# Patient Record
Sex: Female | Born: 1965 | Race: White | Hispanic: No | Smoking: Current every day smoker
Health system: Southern US, Community
[De-identification: ages and names within clinical notes are randomized; demographics above are authoritative.]

## PROBLEM LIST (undated history)

## (undated) DIAGNOSIS — F419 Anxiety disorder, unspecified: Secondary | ICD-10-CM

## (undated) DIAGNOSIS — M1990 Unspecified osteoarthritis, unspecified site: Secondary | ICD-10-CM

## (undated) DIAGNOSIS — I10 Essential (primary) hypertension: Secondary | ICD-10-CM

## (undated) HISTORY — DX: Anxiety disorder, unspecified: F41.9

## (undated) HISTORY — PX: OTHER SURGICAL HISTORY: SHX169

## (undated) HISTORY — DX: Unspecified osteoarthritis, unspecified site: M19.90

---

## 2003-03-28 ENCOUNTER — Other Ambulatory Visit: Admission: RE | Admit: 2003-03-28 | Discharge: 2003-03-28 | Payer: Self-pay | Admitting: Obstetrics and Gynecology

## 2003-11-22 ENCOUNTER — Ambulatory Visit (HOSPITAL_COMMUNITY): Admission: RE | Admit: 2003-11-22 | Discharge: 2003-11-22 | Payer: Self-pay | Admitting: Obstetrics & Gynecology

## 2006-05-17 ENCOUNTER — Ambulatory Visit: Payer: Self-pay | Admitting: Psychiatry

## 2006-05-17 ENCOUNTER — Emergency Department (HOSPITAL_COMMUNITY): Admission: EM | Admit: 2006-05-17 | Discharge: 2006-05-17 | Payer: Self-pay | Admitting: Emergency Medicine

## 2006-05-17 ENCOUNTER — Inpatient Hospital Stay (HOSPITAL_COMMUNITY): Admission: RE | Admit: 2006-05-17 | Discharge: 2006-05-20 | Payer: Self-pay | Admitting: Psychiatry

## 2010-04-23 ENCOUNTER — Emergency Department (HOSPITAL_COMMUNITY)
Admission: EM | Admit: 2010-04-23 | Discharge: 2010-04-23 | Disposition: A | Discharge status: Home / Self Care | Payer: Self-pay | Attending: Emergency Medicine | Admitting: Emergency Medicine

## 2010-04-23 DIAGNOSIS — M5430 Sciatica, unspecified side: Secondary | ICD-10-CM | POA: Insufficient documentation

## 2010-04-23 DIAGNOSIS — M549 Dorsalgia, unspecified: Secondary | ICD-10-CM | POA: Insufficient documentation

## 2011-03-04 ENCOUNTER — Emergency Department (HOSPITAL_COMMUNITY)
Admission: EM | Admit: 2011-03-04 | Discharge: 2011-03-04 | Disposition: A | Discharge status: Home / Self Care | Payer: Self-pay | Attending: Emergency Medicine | Admitting: Emergency Medicine

## 2011-03-04 ENCOUNTER — Encounter (HOSPITAL_COMMUNITY): Payer: Self-pay | Admitting: *Deleted

## 2011-03-04 DIAGNOSIS — M545 Low back pain, unspecified: Secondary | ICD-10-CM | POA: Insufficient documentation

## 2011-03-04 DIAGNOSIS — F17200 Nicotine dependence, unspecified, uncomplicated: Secondary | ICD-10-CM | POA: Insufficient documentation

## 2011-03-04 DIAGNOSIS — M549 Dorsalgia, unspecified: Secondary | ICD-10-CM

## 2011-03-04 DIAGNOSIS — I10 Essential (primary) hypertension: Secondary | ICD-10-CM | POA: Insufficient documentation

## 2011-03-04 DIAGNOSIS — Z79899 Other long term (current) drug therapy: Secondary | ICD-10-CM | POA: Insufficient documentation

## 2011-03-04 HISTORY — DX: Essential (primary) hypertension: I10

## 2011-03-04 MED ORDER — HYDROCODONE-ACETAMINOPHEN 5-325 MG PO TABS
2.0000 | ORAL_TABLET | Freq: Once | ORAL | Status: AC
  Administered 2011-03-04: 2 via ORAL
  Filled 2011-03-04: qty 2

## 2011-03-04 MED ORDER — DIAZEPAM 5 MG PO TABS
5.0000 mg | ORAL_TABLET | Freq: Once | ORAL | Status: AC
  Administered 2011-03-04: 5 mg via ORAL
  Filled 2011-03-04: qty 1

## 2011-03-04 MED ORDER — METHOCARBAMOL 500 MG PO TABS: ORAL_TABLET | ORAL | Status: DC

## 2011-03-04 MED ORDER — ONDANSETRON HCL 4 MG PO TABS
4.0000 mg | ORAL_TABLET | Freq: Once | ORAL | Status: AC
  Administered 2011-03-04: 4 mg via ORAL
  Filled 2011-03-04: qty 1

## 2011-03-04 MED ORDER — HYDROCODONE-ACETAMINOPHEN 5-500 MG PO TABS: 1.0000 | ORAL_TABLET | Freq: Four times a day (QID) | ORAL | Status: AC | PRN

## 2011-03-04 NOTE — ED Notes (Signed)
Pt states has had chronic back in past however a few days ago pt was assisting a resident with a bath when she felt strain on her back. Pt reports taking motrin without relief.

## 2011-03-04 NOTE — ED Provider Notes (Signed)
History     CSN: [id number]  Arrival date & time 03/04/11  1458   First MD Initiated Contact with Patient 03/04/11 1544      Chief Complaint  Patient presents with  . Back Pain    (Consider location/radiation/quality/duration/timing/severity/associated sxs/prior treatment) HPI Comments: The patient has history of lower back problems for the past 3 months she has been treating it with conservative management and most of the time the pain would go away. 4 days ago the patient states she bent over while at work and felt a strong pull in her back. Since that time she has had increasing pain of the lower back. This pain is aggravated by certain positions. She has not had a any loss of bowel or bladder function. There've been no falls.  The history is provided by the patient.    Past Medical History  Diagnosis Date  . Hypertension     Past Surgical History  Procedure Date  . Arm surgery     History reviewed. No pertinent family history.  History  Substance Use Topics  . Smoking status: Current Everyday Smoker  . Smokeless tobacco: Not on file  . Alcohol Use: Yes    OB History    Grav Para Term Preterm Abortions TAB SAB Ect Mult Living                  Review of Systems  Constitutional: Negative for activity change.       All ROS Neg except as noted in HPI  HENT: Negative for nosebleeds and neck pain.   Eyes: Negative for photophobia and discharge.  Respiratory: Negative for cough, shortness of breath and wheezing.   Cardiovascular: Negative for chest pain and palpitations.  Gastrointestinal: Negative for abdominal pain and blood in stool.  Genitourinary: Negative for dysuria, frequency and hematuria.  Musculoskeletal: Positive for back pain. Negative for arthralgias.  Skin: Negative.   Neurological: Negative for dizziness, seizures and speech difficulty.  Psychiatric/Behavioral: Negative for hallucinations and confusion.    Allergies  Review of patient's  allergies indicates no known allergies.  Home Medications   Current Outpatient Rx  Name Route Sig Dispense Refill  . GOODY HEADACHE PO Oral Take 1 packet by mouth once as needed. For pain    . IBUPROFEN 200 MG PO TABS Oral Take 800 mg by mouth as needed. For pain    . LISINOPRIL-HYDROCHLOROTHIAZIDE 20-12.5 MG PO TABS Oral Take 1 tablet by mouth daily.      BP 135/77  Pulse 88  Temp(Src) 98 F (36.7 C) (Oral)  Resp 20  Ht 5\' 2"  (1.575 m)  Wt 205 lb (92.987 kg)  BMI 37.49 kg/m2  SpO2 100%  LMP 02/11/2011  Physical Exam  Nursing note and vitals reviewed. Constitutional: She is oriented to person, place, and time. She appears well-developed and well-nourished.  Non-toxic appearance.  HENT:  Head: Normocephalic.  Right Ear: Tympanic membrane and external ear normal.  Left Ear: Tympanic membrane and external ear normal.  Eyes: EOM and lids are normal. Pupils are equal, round, and reactive to light.  Neck: Normal range of motion. Neck supple. Carotid bruit is not present.  Cardiovascular: Normal rate, regular rhythm, normal heart sounds, intact distal pulses and normal pulses.   Pulmonary/Chest: Breath sounds normal. No respiratory distress.  Abdominal: Soft. Bowel sounds are normal. There is no tenderness. There is no guarding.  Musculoskeletal: Normal range of motion.       Pain to palpation of the lumbar area.  Pain with attempted range of motion of the lumbar area.  Lymphadenopathy:       Head (right side): No submandibular adenopathy present.       Head (left side): No submandibular adenopathy present.    She has no cervical adenopathy.  Neurological: She is alert and oriented to person, place, and time. She has normal strength. No cranial nerve deficit or sensory deficit. She exhibits normal muscle tone. Coordination normal.       Gait within normal limits.  Skin: Skin is warm and dry.  Psychiatric: She has a normal mood and affect. Her speech is normal.    ED Course    Procedures (including critical care time) Pulse oximetry 100% on room air. Within normal limits by my interpretation. Labs Reviewed - No data to display No results found.   No diagnosis found.    MDM  I have reviewed nursing notes, vital signs, and all appropriate lab and imaging results for this patient.        Kathie Dike, [place] 03/04/11 [phone]

## 2011-03-04 NOTE — ED Notes (Signed)
Pt c/o lower back pain x 3 months. States that she bent over on Friday and it aggravated it. Pt states that the pain is worse.

## 2011-03-05 NOTE — ED Provider Notes (Signed)
Medical screening examination/treatment/procedure(s) were performed by non-physician practitioner and as supervising physician I was immediately available for consultation/collaboration.  Nicoletta Dress. Colon Branch, MD 03/05/11 1504

## 2015-03-06 ENCOUNTER — Telehealth: Payer: Self-pay | Admitting: Orthopaedic Surgery

## 2015-03-06 MED ORDER — HYDROCODONE-ACETAMINOPHEN 7.5-325 MG PO TABS: 1.0000 | ORAL_TABLET | ORAL | Status: DC | PRN

## 2015-03-06 NOTE — Telephone Encounter (Signed)
Rx printed

## 2015-03-06 NOTE — Telephone Encounter (Signed)
Have attempted to call patient several times to let her know her prescription is available for pick up but voicemail is full,  and I can not leave a message.

## 2015-04-04 ENCOUNTER — Telehealth: Payer: Self-pay | Admitting: Orthopaedic Surgery

## 2015-04-04 MED ORDER — HYDROCODONE-ACETAMINOPHEN 7.5-325 MG PO TABS: 1.0000 | ORAL_TABLET | ORAL | Status: DC | PRN

## 2015-04-04 NOTE — Telephone Encounter (Signed)
Rx printed

## 2015-04-04 NOTE — Telephone Encounter (Signed)
Patient called and requested a refill on Norco 7.5-325 mgs.  Qty 120.  This was last filled on 03-06-15. °

## 2015-05-01 ENCOUNTER — Ambulatory Visit (INDEPENDENT_AMBULATORY_CARE_PROVIDER_SITE_OTHER): Payer: Self-pay

## 2015-05-01 ENCOUNTER — Ambulatory Visit (INDEPENDENT_AMBULATORY_CARE_PROVIDER_SITE_OTHER): Payer: Self-pay | Admitting: Orthopaedic Surgery

## 2015-05-01 VITALS — BP 187/91 | HR 100 | Temp 98.8°F | Ht 62.0 in | Wt 198.6 lb

## 2015-05-01 DIAGNOSIS — M25562 Pain in left knee: Secondary | ICD-10-CM

## 2015-05-01 MED ORDER — HYDROCODONE-ACETAMINOPHEN 7.5-325 MG PO TABS: 1.0000 | ORAL_TABLET | ORAL | Status: DC | PRN

## 2015-05-01 NOTE — Progress Notes (Signed)
Patient Danielle [name], female DOB:07/10/1965, 50 y.o. VWU:[id number]  Chief Complaint  Patient presents with  . Follow-up    Bilateral knee pain left started hurting 10 days ago    HPI  Danielle [name] is a 50 y.o. female who has had pain in the right knee for some time.  The right knee has some swelling and popping.  She has no giving way, no trauma  About 10 days ago the left knee, which had been her "good" knee, started having pain, swelling.  She has no instability but felt once the knee might buckle but it did not.  She has no redness.  She has tried ice and heat with no help.  The swelling has gotten worse.  It hurts most of the time now.  Her medicine has not helped.  HPI  Body mass index is 36.32 kg/(m^2).   Review of Systems  HENT: Negative for congestion.   Respiratory: Negative for cough and shortness of breath.   Cardiovascular: Negative for chest pain and leg swelling.  Endocrine: Positive for cold intolerance.  Musculoskeletal: Positive for myalgias, joint swelling, arthralgias and gait problem.  Allergic/Immunologic: Negative for environmental allergies.    Past Medical History  Diagnosis Date  . Hypertension     Past Surgical History  Procedure Laterality Date  . Arm surgery      No family history on file.  Social History Social History  Substance Use Topics  . Smoking status: Current Every Day Smoker  . Smokeless tobacco: Not on file  . Alcohol Use: Yes    No Known Allergies  Current Outpatient Prescriptions  Medication Sig Dispense Refill  . HYDROcodone-acetaminophen (NORCO) 7.5-325 MG tablet Take 1 tablet by mouth every 4 (four) hours as needed for moderate pain (Must last 30 days.  Do not drive or operate machinery while taking this medicine.). 120 tablet 0  . ibuprofen (ADVIL,MOTRIN) 200 MG tablet Take 800 mg by mouth as needed. For pain    . Aspirin-Acetaminophen-Caffeine (GOODY HEADACHE PO) Take 1 packet by mouth once as needed. Reported on  05/01/2015    . lisinopril-hydrochlorothiazide (PRINZIDE,ZESTORETIC) 20-12.5 MG per tablet Take 1 tablet by mouth daily. Reported on 05/01/2015    . methocarbamol (ROBAXIN) 500 MG tablet 2 po tid for spasm (Patient not taking: Reported on 05/01/2015) 30 tablet 0   No current facility-administered medications for this visit.     Physical Exam  Blood pressure 187/91, pulse 100, temperature 98.8 F (37.1 C), height  (1.575 m), weight 198 lb 9.6 oz (90.084 kg).  Constitutional: overall normal hygiene, normal nutrition, well developed, normal grooming, normal body habitus. Assistive device:none  Musculoskeletal: gait and station Limp left, muscle tone and strength are normal, no tremors or atrophy is present.  .  Neurological: coordination overall normal.  Deep tendon reflex/nerve stretch intact.  Sensation normal.  Cranial nerves II-XII intact.   Skin:   normal overall no scars, lesions, ulcers or rashes. No psoriasis.  Psychiatric: Alert and oriented x 3.  Recent memory intact, remote memory unclear.  Normal mood and affect. Well groomed.  Good eye contact.  Cardiovascular: overall no swelling, no varicosities, no edema bilaterally, normal temperatures of the legs and arms, no clubbing, cyanosis and good capillary refill.  Lymphatic: palpation is normal.  The left lower extremity is examined:  Inspection:  Thigh:  Non-tender and no defects  Knee has swelling 2+ effusion.  Joint tenderness is present                        Patient is tender over the medial joint line  Lower Leg:  Has normal appearance and no tenderness or defects  Ankle:  Non-tender and no defects  Foot:  Non-tender and no defects Range of Motion:  Knee:  Range of motion is: 0-105                        Crepitus is  present  Ankle:  Range of motion is normal. Strength and Tone:  The left lower extremity has normal strength and tone. Stability:  Knee:  The knee is stable.  Ankle:  The  ankle is stable.  The right lower extremity is examined:  Inspection:  Thigh:  Non-tender and no defects  Knee does not have swelling 0 effusion.                        Joint tenderness is present                        Patient is tender over the medial joint line  Lower Leg:  Has normal appearance and no tenderness or defects  Ankle:  Non-tender and no defects  Foot:  Non-tender and no defects Range of Motion:  Knee:  Range of motion is: 0-110                        Crepitus is  present  Ankle:  Range of motion is normal. Strength and Tone:  The right lower extremity has normal strength and tone. Stability:  Knee:  The knee is stable.  Ankle:  The ankle is stable.  She has hypertension and it is well controlled. She has no edema.  She watches her salt intake.  She smokes.  I have talked to her about cutting back or stopping.  The patient has been educated about the nature of the problem(s) and counseled on treatment options.  The patient appeared to understand what I have discussed and is in agreement with it.  Encounter Diagnosis  Name Primary?  . Left knee pain Yes   PROCEDURE NOTE:  The patient request injection, verbal consent was obtained.  The left knee was prepped appropriately after time out was performed.   Sterile technique was observed and anesthesia was provided by ethyl chloride and a 20-gauge needle was used to inject the knee area.  A 16-gauge needle was then used to aspirate the knee.  Color of fluid aspirated was clear light amber  Total cc's aspirated was 30.    Injection of 1 cc of Depo-Medrol 40 mg with several cc's of plain xylocaine was then performed.  A band aid dressing was applied.  The patient was advised to apply ice later today and tomorrow to the injection sight as needed. PLAN Call if any problems.  Precautions discussed.  Continue current medications.   Return to clinic 6 weeks

## 2015-06-04 ENCOUNTER — Telehealth: Payer: Self-pay | Admitting: Orthopaedic Surgery

## 2015-06-04 MED ORDER — HYDROCODONE-ACETAMINOPHEN 7.5-325 MG PO TABS: 1.0000 | ORAL_TABLET | ORAL | Status: DC | PRN

## 2015-06-04 NOTE — Telephone Encounter (Signed)
Hydrocodone-Acetaminophen 7.5/325mg Qty 120 Tablets °

## 2015-06-04 NOTE — Telephone Encounter (Signed)
Rx Done . 

## 2015-06-11 ENCOUNTER — Encounter: Payer: Self-pay | Admitting: Orthopaedic Surgery

## 2015-06-11 ENCOUNTER — Ambulatory Visit (INDEPENDENT_AMBULATORY_CARE_PROVIDER_SITE_OTHER): Payer: Self-pay | Admitting: Orthopaedic Surgery

## 2015-06-11 VITALS — BP 197/96 | HR 96 | Temp 98.1°F | Ht 62.0 in | Wt 198.0 lb

## 2015-06-11 DIAGNOSIS — M25562 Pain in left knee: Secondary | ICD-10-CM

## 2015-06-11 NOTE — Progress Notes (Signed)
CC:  I have pain of my left knee. I would like an injection.  The patient has chronic pain of the left knee.  There is no recent trauma.  There is no redness.  Injections in the past have helped.  The knee has no redness, has an effusion and crepitus present.  ROM of the knee is 0-105.  Impression:  Chronic knee pain left.  Return: six weeks  PROCEDURE NOTE:  The patient requests injections of the left knee , verbal consent was obtained.  The left knee was prepped appropriately after time out was performed.   Sterile technique was observed and injection of 1 cc of Depo-Medrol 40 mg with several cc's of plain xylocaine. Anesthesia was provided by ethyl chloride and a 20-gauge needle was used to inject the knee area. The injection was tolerated well.  A band aid dressing was applied.  The patient was advised to apply ice later today and tomorrow to the injection sight as needed. 

## 2015-06-12 ENCOUNTER — Ambulatory Visit: Payer: Self-pay | Admitting: Orthopaedic Surgery

## 2015-07-03 ENCOUNTER — Telehealth: Payer: Self-pay | Admitting: Orthopaedic Surgery

## 2015-07-03 MED ORDER — HYDROCODONE-ACETAMINOPHEN 7.5-325 MG PO TABS: 1.0000 | ORAL_TABLET | ORAL | Status: DC | PRN

## 2015-07-03 NOTE — Telephone Encounter (Signed)
Rx done. 

## 2015-07-03 NOTE — Telephone Encounter (Signed)
Patient called and requested a refill on Norco  7.5-325 mgs. Qty 120   Sig: Take 1 tablet by mouth every 4 (four) hours as needed for moderate pain (Must last 30 days.  Do not drive or operate machinery while taking this medicine °

## 2015-07-23 ENCOUNTER — Ambulatory Visit: Payer: Self-pay | Admitting: Orthopaedic Surgery

## 2015-08-01 ENCOUNTER — Ambulatory Visit (INDEPENDENT_AMBULATORY_CARE_PROVIDER_SITE_OTHER): Payer: Self-pay | Admitting: Orthopaedic Surgery

## 2015-08-01 ENCOUNTER — Encounter: Payer: Self-pay | Admitting: Orthopaedic Surgery

## 2015-08-01 VITALS — BP 186/93 | HR 104 | Temp 98.6°F | Ht 62.0 in | Wt 207.0 lb

## 2015-08-01 DIAGNOSIS — M25562 Pain in left knee: Secondary | ICD-10-CM

## 2015-08-01 MED ORDER — HYDROCODONE-ACETAMINOPHEN 7.5-325 MG PO TABS: 1.0000 | ORAL_TABLET | ORAL | Status: DC | PRN

## 2015-08-01 NOTE — Progress Notes (Signed)
CC:  I have pain of my right knee. I would like an injection.  The patient has chronic pain of the right knee.  There is no recent trauma.  There is no redness.  Injections in the past have helped.  The knee has no redness, has an effusion and crepitus present.  ROM of the knee is 0-110.  Impression:  Chronic knee pain right.  Return: 2 months   PROCEDURE NOTE:  The patient requests injections of the right knee , verbal consent was obtained.  The right knee was prepped appropriately after time out was performed.   Sterile technique was observed and injection of 1 cc of Depo-Medrol 40 mg with several cc's of plain xylocaine. Anesthesia was provided by ethyl chloride and a 20-gauge needle was used to inject the knee area. The injection was tolerated well.  A band aid dressing was applied.  The patient was advised to apply ice later today and tomorrow to the injection sight as needed.  Electronically Signed Darreld McleanWayne Shevaun Lovan, MD 7/6/20173:06 PM

## 2015-08-07 ENCOUNTER — Emergency Department (HOSPITAL_COMMUNITY): Payer: Self-pay

## 2015-08-07 ENCOUNTER — Encounter (HOSPITAL_COMMUNITY): Payer: Self-pay | Admitting: Emergency Medicine

## 2015-08-07 ENCOUNTER — Inpatient Hospital Stay (HOSPITAL_COMMUNITY)
Admission: EM | Admit: 2015-08-07 | Discharge: 2015-08-09 | DRG: 193 | Disposition: A | Discharge status: Home / Self Care | Payer: Self-pay | Attending: Internal Medicine | Admitting: Internal Medicine

## 2015-08-07 DIAGNOSIS — R6510 Systemic inflammatory response syndrome (SIRS) of non-infectious origin without acute organ dysfunction: Secondary | ICD-10-CM

## 2015-08-07 DIAGNOSIS — E876 Hypokalemia: Secondary | ICD-10-CM | POA: Diagnosis present

## 2015-08-07 DIAGNOSIS — I10 Essential (primary) hypertension: Secondary | ICD-10-CM | POA: Diagnosis present

## 2015-08-07 DIAGNOSIS — J189 Pneumonia, unspecified organism: Principal | ICD-10-CM | POA: Diagnosis present

## 2015-08-07 DIAGNOSIS — J9601 Acute respiratory failure with hypoxia: Secondary | ICD-10-CM | POA: Diagnosis present

## 2015-08-07 DIAGNOSIS — Z8249 Family history of ischemic heart disease and other diseases of the circulatory system: Secondary | ICD-10-CM

## 2015-08-07 DIAGNOSIS — F17210 Nicotine dependence, cigarettes, uncomplicated: Secondary | ICD-10-CM | POA: Diagnosis present

## 2015-08-07 DIAGNOSIS — D72829 Elevated white blood cell count, unspecified: Secondary | ICD-10-CM

## 2015-08-07 LAB — CBC WITH DIFFERENTIAL/PLATELET
BASOS PCT: 0 %
Basophils Absolute: 0.1 10*3/uL (ref 0.0–0.1)
EOS PCT: 1 %
Eosinophils Absolute: 0.1 10*3/uL (ref 0.0–0.7)
HEMATOCRIT: 33.9 % — AB (ref 36.0–46.0)
Hemoglobin: 11.4 g/dL — ABNORMAL LOW (ref 12.0–15.0)
LYMPHS PCT: 5 %
Lymphs Abs: 0.9 10*3/uL (ref 0.7–4.0)
MCH: 30.4 pg (ref 26.0–34.0)
MCHC: 33.6 g/dL (ref 30.0–36.0)
MCV: 90.4 fL (ref 78.0–100.0)
MONO ABS: 0.4 10*3/uL (ref 0.1–1.0)
MONOS PCT: 3 %
NEUTROS ABS: 15.5 10*3/uL — AB (ref 1.7–7.7)
Neutrophils Relative %: 91 %
PLATELETS: 372 10*3/uL (ref 150–400)
RBC: 3.75 MIL/uL — ABNORMAL LOW (ref 3.87–5.11)
RDW: 12.8 % (ref 11.5–15.5)
WBC: 17 10*3/uL — ABNORMAL HIGH (ref 4.0–10.5)

## 2015-08-07 LAB — I-STAT TROPONIN, ED: Troponin i, poc: 0 ng/mL (ref 0.00–0.08)

## 2015-08-07 LAB — BASIC METABOLIC PANEL
Anion gap: 10 (ref 5–15)
BUN: 8 mg/dL (ref 6–20)
CALCIUM: 8.4 mg/dL — AB (ref 8.9–10.3)
CO2: 26 mmol/L (ref 22–32)
CREATININE: 0.74 mg/dL (ref 0.44–1.00)
Chloride: 101 mmol/L (ref 101–111)
GFR calc Af Amer: >60 mL/min (ref >60)
GFR calc non Af Amer: >60 mL/min (ref >60)
GLUCOSE: 156 mg/dL — AB (ref 65–99)
Potassium: 2.9 mmol/L — ABNORMAL LOW (ref 3.5–5.1)
Sodium: 137 mmol/L (ref 135–145)

## 2015-08-07 LAB — LACTIC ACID, PLASMA: Lactic Acid, Venous: 1 mmol/L (ref 0.5–1.9)

## 2015-08-07 LAB — MAGNESIUM: MAGNESIUM: 2.1 mg/dL (ref 1.7–2.4)

## 2015-08-07 LAB — STREP PNEUMONIAE URINARY ANTIGEN: Strep Pneumo Urinary Antigen: NEGATIVE

## 2015-08-07 LAB — BRAIN NATRIURETIC PEPTIDE: B Natriuretic Peptide: 99 pg/mL (ref 0.0–100.0)

## 2015-08-07 MED ORDER — HYDROCODONE-ACETAMINOPHEN 7.5-325 MG PO TABS
1.0000 | ORAL_TABLET | ORAL | Status: DC | PRN
  Administered 2015-08-09: 1 via ORAL
  Filled 2015-08-07: qty 1

## 2015-08-07 MED ORDER — ONDANSETRON HCL 4 MG/2ML IJ SOLN: 4.0000 mg | Freq: Three times a day (TID) | INTRAMUSCULAR | Status: AC | PRN

## 2015-08-07 MED ORDER — ONDANSETRON HCL 4 MG/2ML IJ SOLN: 4.0000 mg | Freq: Four times a day (QID) | INTRAMUSCULAR | Status: DC | PRN

## 2015-08-07 MED ORDER — ONDANSETRON HCL 4 MG PO TABS: 4.0000 mg | ORAL_TABLET | Freq: Four times a day (QID) | ORAL | Status: DC | PRN

## 2015-08-07 MED ORDER — METHYLPREDNISOLONE SODIUM SUCC 125 MG IJ SOLR
125.0000 mg | Freq: Once | INTRAMUSCULAR | Status: AC
  Administered 2015-08-07: 125 mg via INTRAVENOUS
  Filled 2015-08-07: qty 2

## 2015-08-07 MED ORDER — AZITHROMYCIN 250 MG PO TABS
500.0000 mg | ORAL_TABLET | Freq: Once | ORAL | Status: AC
  Administered 2015-08-07: 500 mg via ORAL
  Filled 2015-08-07: qty 2

## 2015-08-07 MED ORDER — IPRATROPIUM BROMIDE 0.02 % IN SOLN
0.5000 mg | Freq: Once | RESPIRATORY_TRACT | Status: AC
  Administered 2015-08-07: 0.5 mg via RESPIRATORY_TRACT
  Filled 2015-08-07: qty 2.5

## 2015-08-07 MED ORDER — ACETAMINOPHEN 650 MG RE SUPP: 650.0000 mg | Freq: Four times a day (QID) | RECTAL | Status: DC | PRN

## 2015-08-07 MED ORDER — ACETAMINOPHEN 325 MG PO TABS: 650.0000 mg | ORAL_TABLET | Freq: Four times a day (QID) | ORAL | Status: DC | PRN

## 2015-08-07 MED ORDER — LISINOPRIL 10 MG PO TABS
20.0000 mg | ORAL_TABLET | Freq: Every day | ORAL | Status: DC
  Administered 2015-08-07 – 2015-08-09 (×3): 20 mg via ORAL
  Filled 2015-08-07 (×3): qty 2

## 2015-08-07 MED ORDER — AZITHROMYCIN 250 MG PO TABS
500.0000 mg | ORAL_TABLET | ORAL | Status: DC
  Administered 2015-08-08: 500 mg via ORAL
  Filled 2015-08-07: qty 2

## 2015-08-07 MED ORDER — SODIUM CHLORIDE 0.9 % IV SOLN
INTRAVENOUS | Status: DC
  Administered 2015-08-07 – 2015-08-09 (×3): via INTRAVENOUS

## 2015-08-07 MED ORDER — ALBUTEROL SULFATE (2.5 MG/3ML) 0.083% IN NEBU
5.0000 mg | INHALATION_SOLUTION | Freq: Once | RESPIRATORY_TRACT | Status: AC
Start: 2015-08-07 — End: 2015-08-07
  Administered 2015-08-07: 5 mg via RESPIRATORY_TRACT
  Filled 2015-08-07: qty 6

## 2015-08-07 MED ORDER — BENZONATATE 100 MG PO CAPS
100.0000 mg | ORAL_CAPSULE | Freq: Two times a day (BID) | ORAL | Status: DC | PRN
  Administered 2015-08-08 – 2015-08-09 (×3): 100 mg via ORAL
  Filled 2015-08-07 (×3): qty 1

## 2015-08-07 MED ORDER — POTASSIUM CHLORIDE CRYS ER 20 MEQ PO TBCR
40.0000 meq | EXTENDED_RELEASE_TABLET | ORAL | Status: AC
  Administered 2015-08-07 – 2015-08-08 (×4): 40 meq via ORAL
  Filled 2015-08-07 (×4): qty 2

## 2015-08-07 MED ORDER — SENNOSIDES-DOCUSATE SODIUM 8.6-50 MG PO TABS: 1.0000 | ORAL_TABLET | Freq: Every evening | ORAL | Status: DC | PRN

## 2015-08-07 MED ORDER — CEFTRIAXONE SODIUM 1 G IJ SOLR
1.0000 g | Freq: Once | INTRAMUSCULAR | Status: AC
  Administered 2015-08-07: 1 g via INTRAVENOUS
  Filled 2015-08-07: qty 10

## 2015-08-07 MED ORDER — NICOTINE 21 MG/24HR TD PT24
21.0000 mg | MEDICATED_PATCH | Freq: Every day | TRANSDERMAL | Status: DC
  Administered 2015-08-08 – 2015-08-09 (×2): 21 mg via TRANSDERMAL
  Filled 2015-08-07 (×3): qty 1

## 2015-08-07 MED ORDER — ENOXAPARIN SODIUM 40 MG/0.4ML ~~LOC~~ SOLN
40.0000 mg | SUBCUTANEOUS | Status: DC
  Administered 2015-08-07 – 2015-08-08 (×2): 40 mg via SUBCUTANEOUS
  Filled 2015-08-07 (×2): qty 0.4

## 2015-08-07 MED ORDER — DEXTROSE 5 % IV SOLN
1.0000 g | INTRAVENOUS | Status: DC
  Administered 2015-08-08: 1 g via INTRAVENOUS
  Filled 2015-08-07 (×5): qty 10

## 2015-08-07 NOTE — ED Notes (Signed)
After walking from triage, pt was 68% on RA. Pt has some shortness of breath. Pt is alert and oriented. Pt was placed on 2L with increase to 91%.

## 2015-08-07 NOTE — H&P (Signed)
History and Physical    CORSICA FRANSON ZOX:[id number] DOB: [birth year] DOA: 08/07/2015  Referring MD/NP/PA: Nelva Nay, EDP PCP: Remus Loffler, PA  Patient coming from: Home  Chief Complaint: Shortness of breath, cough  HPI: Danielle [name] is a 50 y.o. female with history of hypertension presents to the hospital today with a one-week history of shortness of breath and cough. She states that she started with a sinus infection about a week ago, did not seek medical attention. Over the past 3 day shortness of breath has progressed, she also has a cough productive of green sputum. Denies any fevers or chills. She checked her oxygen level at home and was found to be 80%, she came to the hospital for evaluation and management. In the ED she was found to have an oxygen level of low 90s on room air, a potassium level of 2.9, WBCs count of 17, a chest x-ray showed diffuse bilateral interstitial airspace opacities question diffuse infection/pneumonia versus edema. Admission has been requested for further evaluation and management.  Past Medical/Surgical History: Past Medical History  Diagnosis Date  . Hypertension     Past Surgical History  Procedure Laterality Date  . Arm surgery      Social History:  reports that she has been smoking Cigarettes.  She has been smoking about 1.00 pack per day. She does not have any smokeless tobacco history on file. She reports that she drinks alcohol. She reports that she does not use illicit drugs.  Allergies: Allergies  Allergen Reactions  . Penicillins Rash    Has patient had a PCN reaction causing immediate rash, facial/tongue/throat swelling, SOB or lightheadedness with hypotension: Yesyes Has patient had a PCN reaction causing severe rash involving mucus membranes or skin necrosis: No Has patient had a PCN reaction that required hospitalization No Has patient had a PCN reaction occurring within the last 10 years: No If all of the above answers are  "NO", then may proceed with Cephalosporin use.     Family History:  Significant for hypertension in mother  Prior to Admission medications   Medication Sig Start Date End Date Taking? Authorizing Provider  HYDROcodone-acetaminophen (NORCO) 7.5-325 MG tablet Take 1 tablet by mouth every 4 (four) hours as needed for moderate pain (Must last 30 days.  Do not drive or operate machinery while taking this medicine.). 08/01/15  Yes Darreld Mclean, MD  Aspirin-Acetaminophen-Caffeine (GOODY HEADACHE PO) Take 1 packet by mouth once as needed. Reported on 05/01/2015    Historical Provider, MD  ibuprofen (ADVIL,MOTRIN) 200 MG tablet Take 800 mg by mouth every 8 (eight) hours as needed for mild pain. For pain    Historical Provider, MD  lisinopril-hydrochlorothiazide (PRINZIDE,ZESTORETIC) 20-12.5 MG per tablet Take 1 tablet by mouth daily. Reported on 08/01/2015    Historical Provider, MD  methocarbamol (ROBAXIN) 500 MG tablet 2 po tid for spasm Patient not taking: Reported on 08/07/2015 03/04/11   Ivery Quale, PA-C    Review of Systems: Constitutional: Denies fever, chills, diaphoresis, appetite change and fatigue.  HEENT: Denies photophobia, eye pain, redness, hearing loss, ear pain, congestion, sore throat, rhinorrhea, sneezing, mouth sores, trouble swallowing, neck pain, neck stiffness and tinnitus.   Respiratory: Positive for SOB, DOE, cough, chest tightness,  and wheezing.   Cardiovascular: Denies chest pain, palpitations and leg swelling.  Gastrointestinal: Denies nausea, vomiting, abdominal pain, diarrhea, constipation, blood in stool and abdominal distention.  Genitourinary: Denies dysuria, urgency, frequency, hematuria, flank pain and difficulty urinating.  Endocrine: Denies: hot  or cold intolerance, sweats, changes in hair or nails, polyuria, polydipsia. Musculoskeletal: Denies myalgias, back pain, joint swelling, arthralgias and gait problem.  Skin: Denies pallor, rash and wound.  Neurological:  Denies dizziness, seizures, syncope, weakness, light-headedness, numbness and headaches.  Hematological: Denies adenopathy. Easy bruising, personal or family bleeding history  Psychiatric/Behavioral: Denies suicidal ideation, mood changes, confusion, nervousness, sleep disturbance and agitation    Physical Exam: Filed Vitals:   08/07/15 1332 08/07/15 1400 08/07/15 1500 08/07/15 1610  BP:  161/74 149/59 161/79  Pulse:  109 96 94  Temp:    98.3 F (36.8 C)  TempSrc:    Oral  Resp:  16 14 18   Height:    5\' 2"  (1.575 m)  Weight:    92.851 kg (204 lb 11.2 oz)  SpO2: 92% 97% 95% 93%     Constitutional: NAD, calm, comfortable Eyes: PERRL, lids and conjunctivae normal ENMT: Mucous membranes are moist. Posterior pharynx clear of any exudate or lesions.Normal dentition.  Neck: normal, supple, no masses, no thyromegaly Respiratory: Mild inspiratory and expiratory wheezing, good air movement  Cardiovascular: Regular rate and rhythm, no murmurs / rubs / gallops. No extremity edema. 2+ pedal pulses. No carotid bruits.  Abdomen: no tenderness, no masses palpated. No hepatosplenomegaly. Bowel sounds positive.  Musculoskeletal: no clubbing / cyanosis. No joint deformity upper and lower extremities. Good ROM, no contractures. Normal muscle tone.  Skin: no rashes, lesions, ulcers. No induration Neurologic: CN 2-12 grossly intact. Sensation intact, DTR normal. Strength 5/5 in all 4.  Psychiatric: Normal judgment and insight. Alert and oriented x 3. Normal mood.    Labs on Admission: I have personally reviewed the following labs and imaging studies  CBC:  Recent Labs Lab 08/07/15 1300  WBC 17.0*  NEUTROABS 15.5*  HGB 11.4*  HCT 33.9*  MCV 90.4  PLT 372   Basic Metabolic Panel:  Recent Labs Lab 08/07/15 1300  NA 137  K 2.9*  CL 101  CO2 26  GLUCOSE 156*  BUN 8  CREATININE 0.74  CALCIUM 8.4*   GFR: Estimated Creatinine Clearance: 92.2 mL/min (by C-G formula based on Cr of  0.74). Liver Function Tests: No results for input(s): AST, ALT, ALKPHOS, BILITOT, PROT, ALBUMIN in the last 168 hours. No results for input(s): LIPASE, AMYLASE in the last 168 hours. No results for input(s): AMMONIA in the last 168 hours. Coagulation Profile: No results for input(s): INR, PROTIME in the last 168 hours. Cardiac Enzymes: No results for input(s): CKTOTAL, CKMB, CKMBINDEX, TROPONINI in the last 168 hours. BNP (last 3 results) No results for input(s): PROBNP in the last 8760 hours. HbA1C: No results for input(s): HGBA1C in the last 72 hours. CBG: No results for input(s): GLUCAP in the last 168 hours. Lipid Profile: No results for input(s): CHOL, HDL, LDLCALC, TRIG, CHOLHDL, LDLDIRECT in the last 72 hours. Thyroid Function Tests: No results for input(s): TSH, T4TOTAL, FREET4, T3FREE, THYROIDAB in the last 72 hours. Anemia Panel: No results for input(s): VITAMINB12, FOLATE, FERRITIN, TIBC, IRON, RETICCTPCT in the last 72 hours. Urine analysis: No results found for: COLORURINE, APPEARANCEUR, LABSPEC, PHURINE, GLUCOSEU, HGBUR, BILIRUBINUR, KETONESUR, PROTEINUR, UROBILINOGEN, NITRITE, LEUKOCYTESUR Sepsis Labs: @LABRCNTIP (procalcitonin:4,lacticidven:4) )No results found for this or any previous visit (from the past 240 hour(s)).   Radiological Exams on Admission: Dg Chest 2 View  08/07/2015  CLINICAL DATA:  50 year old female with cough, congestion and shortness of breath for 1 week. EXAM: CHEST  2 VIEW COMPARISON:  None. FINDINGS: Diffuse bilateral interstitial and airspace opacities  are noted. Cardiomegaly identified. There is no evidence of pleural effusion or pneumothorax. No acute or suspicious bony abnormalities are identified. IMPRESSION: Diffuse bilateral interstitial and airspace opacities-question diffuse infection/pneumonia versus edema. Cardiomegaly. Electronically Signed   By: Harmon PierJeffrey  Hu M.D.   On: 08/07/2015 12:59    EKG: Independently reviewed. None obtained in  ED  Assessment/Plan Principal Problem:   CAP (community acquired pneumonia) Active Problems:   Acute respiratory failure with hypoxia (HCC)   SIRS (systemic inflammatory response syndrome) (HCC)   Leukocytosis   Hypokalemia    Acute hypoxemic respiratory failure -On account of community-acquired pneumonia. -Check blood/sputum cultures. -Strep pneumo/legionella urine antigens requested. -Start Rocephin and azithromycin. -Oxygen supplementation as required. -No sepsis is present on admission.  Hypokalemia -Replete orally, check magnesium level.  Leukocytosis -On account of community-acquired pneumonia.   DVT prophylaxis: lovenox  Code Status: Full code  Family Communication: Patient only  Disposition Plan: To be determined  Consults called: None  Admission status: Inpatient    Time Spent: 75 minutes  Chaya JanHERNANDEZ ACOSTA,Colman Birdwell MD [facility] Pager [phone]  If 7PM-7AM, please contact night-coverage [email or web address] Password [email or web address]  08/07/2015, 5:16 PM

## 2015-08-07 NOTE — ED Notes (Signed)
Lab at bedside. Cultures have been collected.

## 2015-08-07 NOTE — ED Notes (Signed)
PT c/o productive green sputum cough x7 days with SOB that started x3 days ago with exertion. PT states no relief from OTC medications for nasal congestion.

## 2015-08-07 NOTE — ED Provider Notes (Signed)
CSN: [id number]     Arrival date & time 08/07/15  1202 History   First MD Initiated Contact with Patient 08/07/15 1236     Chief Complaint  Patient presents with  . Shortness of Breath      HPI Patient presents with 3 to four-day history of increasing shortness of breath associated with wheezing and productive cough.  Patient denies fever or chills.  Patient has smoking history of several years.  Been coughing up green and clear sputum.  No hemoptysis.  No chest pain. Past Medical History  Diagnosis Date  . Hypertension    Past Surgical History  Procedure Laterality Date  . Arm surgery     History reviewed. No pertinent family history. Social History  Substance Use Topics  . Smoking status: Current Every Day Smoker -- 1.00 packs/day    Types: Cigarettes  . Smokeless tobacco: None  . Alcohol Use: Yes     Comment: occassionally   OB History    Gravida Para Term Preterm AB TAB SAB Ectopic Multiple Living   2         2     Review of Systems  All other systems reviewed and are negative  Allergies  Penicillins  Home Medications   Prior to Admission medications   Medication Sig Start Date End Date Taking? Authorizing Provider  HYDROcodone-acetaminophen (NORCO) 7.5-325 MG tablet Take 1 tablet by mouth every 4 (four) hours as needed for moderate pain (Must last 30 days.  Do not drive or operate machinery while taking this medicine.). 08/01/15  Yes Darreld McleanWayne Keeling, MD  Aspirin-Acetaminophen-Caffeine (GOODY HEADACHE PO) Take 1 packet by mouth once as needed. Reported on 05/01/2015    Historical Provider, MD  ibuprofen (ADVIL,MOTRIN) 200 MG tablet Take 800 mg by mouth every 8 (eight) hours as needed for mild pain. For pain    Historical Provider, MD  lisinopril-hydrochlorothiazide (PRINZIDE,ZESTORETIC) 20-12.5 MG per tablet Take 1 tablet by mouth daily. Reported on 08/01/2015    Historical Provider, MD  methocarbamol (ROBAXIN) 500 MG tablet 2 po tid for spasm Patient not taking: Reported  on 08/07/2015 03/04/11   Ivery QualeHobson Bryant, PA-C   BP 161/74 mmHg  Pulse 109  Temp(Src) 99 F (37.2 C) (Oral)  Resp 16  Ht 5\' 2"  (1.575 m)  Wt 207 lb (93.895 kg)  BMI 37.85 kg/m2  SpO2 97%  LMP 07/10/2015 Physical Exam Physical Exam  Nursing note and vitals reviewed. Constitutional: She is oriented to person, place, and time. She appears well-developed and well-nourished.  Mild respiratory distress.  HENT:  Head: Normocephalic and atraumatic.  Eyes: Pupils are equal, round, and reactive to light.  Neck: Normal range of motion.  Cardiovascular: Normal rate and intact distal pulses.   Pulmonary/Chest: Auscultation reveals expiratory wheezes in all lung fields Abdominal: Normal appearance. She exhibits no distension.  Musculoskeletal: Normal range of motion.  Neurological: She is alert and oriented to person, place, and time. No cranial nerve deficit.  Skin: Skin is warm and dry. No rash noted.  Psychiatric: She has a normal mood and affect. Her behavior is normal.   ED Course  Procedures (including critical care time) Medications  albuterol (PROVENTIL) (2.5 MG/3ML) 0.083% nebulizer solution 5 mg (5 mg Nebulization Given 08/07/15 1332)  ipratropium (ATROVENT) nebulizer solution 0.5 mg (0.5 mg Nebulization Given 08/07/15 1332)  methylPREDNISolone sodium succinate (SOLU-MEDROL) 125 mg/2 mL injection 125 mg (125 mg Intravenous Given 08/07/15 1258)  cefTRIAXone (ROCEPHIN) 1 g in dextrose 5 % 50 mL IVPB (1  g Intravenous New Bag/Given 08/07/15 1402)  azithromycin (ZITHROMAX) tablet 500 mg (500 mg Oral Given 08/07/15 1401)    Labs Review Labs Reviewed  CBC WITH DIFFERENTIAL/PLATELET - Abnormal; Notable for the following:    WBC 17.0 (*)    RBC 3.75 (*)    Hemoglobin 11.4 (*)    HCT 33.9 (*)    Neutro Abs 15.5 (*)    All other components within normal limits  BASIC METABOLIC PANEL - Abnormal; Notable for the following:    Potassium 2.9 (*)    Glucose, Bld 156 (*)    Calcium 8.4 (*)    All  other components within normal limits  CULTURE, BLOOD (ROUTINE X 2)  CULTURE, BLOOD (ROUTINE X 2)  BRAIN NATRIURETIC PEPTIDE  LACTIC ACID, PLASMA  I-STAT TROPOININ, ED    Imaging Review Dg Chest 2 View  08/07/2015  CLINICAL DATA:  50 year old female with cough, congestion and shortness of breath for 1 week. EXAM: CHEST  2 VIEW COMPARISON:  None. FINDINGS: Diffuse bilateral interstitial and airspace opacities are noted. Cardiomegaly identified. There is no evidence of pleural effusion or pneumothorax. No acute or suspicious bony abnormalities are identified. IMPRESSION: Diffuse bilateral interstitial and airspace opacities-question diffuse infection/pneumonia versus edema. Cardiomegaly. Electronically Signed   By: Harmon Pier M.D.   On: 08/07/2015 12:59   I have personally reviewed and evaluated these images and lab results as part of my medical decision-making.    MDM   Final diagnoses:  CAP (community acquired pneumonia)        Nelva Nay, MD 08/07/15 1455

## 2015-08-08 LAB — BASIC METABOLIC PANEL
Anion gap: 5 (ref 5–15)
BUN: 13 mg/dL (ref 6–20)
CO2: 28 mmol/L (ref 22–32)
CREATININE: 0.51 mg/dL (ref 0.44–1.00)
Calcium: 8.3 mg/dL — ABNORMAL LOW (ref 8.9–10.3)
Chloride: 106 mmol/L (ref 101–111)
Glucose, Bld: 123 mg/dL — ABNORMAL HIGH (ref 65–99)
Potassium: 4.3 mmol/L (ref 3.5–5.1)
SODIUM: 139 mmol/L (ref 135–145)

## 2015-08-08 LAB — CBC
HCT: 32.5 % — ABNORMAL LOW (ref 36.0–46.0)
Hemoglobin: 10.6 g/dL — ABNORMAL LOW (ref 12.0–15.0)
MCH: 30.1 pg (ref 26.0–34.0)
MCHC: 32.6 g/dL (ref 30.0–36.0)
MCV: 92.3 fL (ref 78.0–100.0)
PLATELETS: 383 10*3/uL (ref 150–400)
RBC: 3.52 MIL/uL — AB (ref 3.87–5.11)
RDW: 12.5 % (ref 11.5–15.5)
WBC: 19.1 10*3/uL — AB (ref 4.0–10.5)

## 2015-08-08 LAB — LEGIONELLA PNEUMOPHILA SEROGP 1 UR AG: L. PNEUMOPHILA SEROGP 1 UR AG: NEGATIVE

## 2015-08-08 LAB — HIV ANTIBODY (ROUTINE TESTING W REFLEX): HIV Screen 4th Generation wRfx: NONREACTIVE

## 2015-08-08 NOTE — Care Management Note (Signed)
Case Management Note  Patient Details  Name: Leo Rodngela P Lenhardt MRN: [id number] Date of Birth: 02/27/1965  Subjective/Objective:                  Pt admitted with CAP. Pt is from home, ind with ADL's. Pt has no insurance but does have PCP. Pt pay for medications OOP. Pt drives herself to appointments. Pt plans to return home with self care. [facility] Pt may need MATCH voucher for cost with medication if not DC'd on med from $4 list.   Action/Plan: Will cont to follow.  Expected Discharge Date:   08/08/2015               Expected Discharge Plan:  Home w Home Health Services  In-House Referral:  Financial Counselor  Discharge planning Services  CM Consult  Post Acute Care Choice:  NA Choice offered to:  NA  DME Arranged:    DME Agency:     HH Arranged:    HH Agency:     Status of Service:  In process, will continue to follow  If discussed at Long Length of Stay Meetings, dates discussed:    Additional Comments:  Malcolm MetroChildress, Darreon Lutes Demske, RN 08/08/2015, 3:17 PM

## 2015-08-08 NOTE — Progress Notes (Signed)
PROGRESS NOTE    Danielle [name]  ZOX:[id number] DOB: 10-Mar-1965 DOA: 08/07/2015 PCP: Remus Loffler, PA     Brief Narrative:  50 year old woman admitted to the hospital on 7/12 from home with complaints of shortness of breath and cough, found to have bilateral pneumonia on chest x-ray.   Assessment & Plan:   Principal Problem:   CAP (community acquired pneumonia) Active Problems:   Acute respiratory failure with hypoxia (HCC)   SIRS (systemic inflammatory response syndrome) (HCC)   Leukocytosis   Hypokalemia   Acute hypoxemic respiratory failure -On account of community-acquired pneumonia. -Culture data remains negative to date. -She has now been weaned off oxygen. -She appears clinically improved although still gets short of breath with ambulation. -No fevers, still has a WBC count of 19.1.  Hypokalemia -Replaced. -Magnesium level is normal at 2.1.   DVT prophylaxis: Lovenox Code Status: Full code Family Communication: Patient only Disposition Plan: Anticipate discharge home in 24-48 hours  Consultants:   None  Procedures:   None  Antimicrobials:   Rocephin  Azithromycin    Subjective: Feels much improved, no further coughing, shortness of breath only with ambulation  Objective: Filed Vitals:   08/07/15 1610 08/07/15 1940 08/08/15 0611 08/08/15 0901  BP: 161/79  143/79 136/66  Pulse: 94  79   Temp: 98.3 F (36.8 C)  98.3 F (36.8 C)   TempSrc: Oral  Oral   Resp: 18  21   Height:  (1.575 m)     Weight: 92.851 kg (204 lb 11.2 oz)     SpO2: 93% 94% 94%     Intake/Output Summary (Last 24 hours) at 08/08/15 1640 Last data filed at 08/08/15 0900  Gross per 24 hour  Intake    480 ml  Output      0 ml  Net    480 ml   Filed Weights   08/07/15 1216 08/07/15 1610  Weight: 93.895 kg (207 lb) 92.851 kg (204 lb 11.2 oz)    Examination:  General exam: Alert, awake, oriented x 3 Respiratory system: Bilateral rhonchi, no  wheezes Cardiovascular system:RRR. No murmurs, rubs, gallops. Gastrointestinal system: Abdomen is nondistended, soft and nontender. No organomegaly or masses felt. Normal bowel sounds heard. Central nervous system: Alert and oriented. No focal neurological deficits. Extremities: No C/C/E, +pedal pulses Skin: No rashes, lesions or ulcers Psychiatry: Judgement and insight appear normal. Mood & affect appropriate.     Data Reviewed: I have personally reviewed following labs and imaging studies  CBC:  Recent Labs Lab 08/07/15 1300 08/08/15 0542  WBC 17.0* 19.1*  NEUTROABS 15.5*  --   HGB 11.4* 10.6*  HCT 33.9* 32.5*  MCV 90.4 92.3  PLT 372 383   Basic Metabolic Panel:  Recent Labs Lab 08/07/15 1300 08/07/15 1756 08/08/15 0542  NA 137  --  139  K 2.9*  --  4.3  CL 101  --  106  CO2 26  --  28  GLUCOSE 156*  --  123*  BUN 8  --  13  CREATININE 0.74  --  0.51  CALCIUM 8.4*  --  8.3*  MG  --  2.1  --    GFR: Estimated Creatinine Clearance: 92.2 mL/min (by C-G formula based on Cr of 0.51). Liver Function Tests: No results for input(s): AST, ALT, ALKPHOS, BILITOT, PROT, ALBUMIN in the last 168 hours. No results for input(s): LIPASE, AMYLASE in the last 168 hours. No results for input(s): AMMONIA in the last 168  hours. Coagulation Profile: No results for input(s): INR, PROTIME in the last 168 hours. Cardiac Enzymes: No results for input(s): CKTOTAL, CKMB, CKMBINDEX, TROPONINI in the last 168 hours. BNP (last 3 results) No results for input(s): PROBNP in the last 8760 hours. HbA1C: No results for input(s): HGBA1C in the last 72 hours. CBG: No results for input(s): GLUCAP in the last 168 hours. Lipid Profile: No results for input(s): CHOL, HDL, LDLCALC, TRIG, CHOLHDL, LDLDIRECT in the last 72 hours. Thyroid Function Tests: No results for input(s): TSH, T4TOTAL, FREET4, T3FREE, THYROIDAB in the last 72 hours. Anemia Panel: No results for input(s): VITAMINB12, FOLATE,  FERRITIN, TIBC, IRON, RETICCTPCT in the last 72 hours. Urine analysis: No results found for: COLORURINE, APPEARANCEUR, LABSPEC, PHURINE, GLUCOSEU, HGBUR, BILIRUBINUR, KETONESUR, PROTEINUR, UROBILINOGEN, NITRITE, LEUKOCYTESUR Sepsis Labs: @LABRCNTIP (procalcitonin:4,lacticidven:4)  ) Recent Results (from the past 240 hour(s))  Culture, blood (Routine X 2) w Reflex to ID Panel     Status: None (Preliminary result)   Collection Time: 08/07/15  1:49 PM  Result Value Ref Range Status   Specimen Description BLOOD LEFT HAND DRAWN BY RN  Final   Special Requests BOTTLES DRAWN AEROBIC ONLY 10CC  Final   Culture NO GROWTH < 24 HOURS  Final   Report Status PENDING  Incomplete  Culture, blood (Routine X 2) w Reflex to ID Panel     Status: None (Preliminary result)   Collection Time: 08/07/15  1:49 PM  Result Value Ref Range Status   Specimen Description BLOOD RIGHT ANTECUBITAL  Final   Special Requests BOTTLES DRAWN AEROBIC AND ANAEROBIC 8CC EACH  Final   Culture NO GROWTH < 24 HOURS  Final   Report Status PENDING  Incomplete         Radiology Studies: Dg Chest 2 View  08/07/2015  CLINICAL DATA:  50 year old female with cough, congestion and shortness of breath for 1 week. EXAM: CHEST  2 VIEW COMPARISON:  None. FINDINGS: Diffuse bilateral interstitial and airspace opacities are noted. Cardiomegaly identified. There is no evidence of pleural effusion or pneumothorax. No acute or suspicious bony abnormalities are identified. IMPRESSION: Diffuse bilateral interstitial and airspace opacities-question diffuse infection/pneumonia versus edema. Cardiomegaly. Electronically Signed   By: Harmon PierJeffrey  Hu M.D.   On: 08/07/2015 12:59        Scheduled Meds: . azithromycin  500 mg Oral Q24H  . cefTRIAXone (ROCEPHIN)  IV  1 g Intravenous Q24H  . enoxaparin (LOVENOX) injection  40 mg Subcutaneous Q24H  . lisinopril  20 mg Oral Daily  . nicotine  21 mg Transdermal Daily   Continuous Infusions: . sodium  chloride 75 mL/hr at 08/07/15 1819     LOS: 1 day    Time spent: 25 minutes. Greater than 50% of this time was spent in direct contact with the patient coordinating care.     Chaya JanHERNANDEZ ACOSTA,Rabiah Goeser, MD [facility] Pager [phone]  If 7PM-7AM, please contact night-coverage [email or web address] Password [email or web address] 08/08/2015, 4:40 PM

## 2015-08-08 NOTE — Progress Notes (Signed)
Nutrition Brief Note  Patient identified on the Malnutrition Screening Tool (MST) Report  Wt Readings from Last 15 Encounters:  08/07/15 204 lb 11.2 oz (92.851 kg)  08/01/15 207 lb (93.895 kg)  06/11/15 198 lb (89.812 kg)  05/01/15 198 lb 9.6 oz (90.084 kg)  03/04/11 205 lb (92.987 kg)    Body mass index is 37.43 kg/(m^2). Patient meets criteria for obese class II based on current BMI. Her weight is stable within 2-3#. Patient says she like to maintain her wt under 200# but had gained a few pounds before she became sick.   Home diet is Regular. Current diet order is Heart Healthy, patient is consuming approximately 50-65% of meals at this time. Patient says her appetite is much better today than it's been in several days.    Recent Labs Lab 08/07/15 1300 08/07/15 1756 08/08/15 0542  NA 137  --  139  K 2.9*  --  4.3  CL 101  --  106  CO2 26  --  28  BUN 8  --  13  CREATININE 0.74  --  0.51  CALCIUM 8.4*  --  8.3*  MG  --  2.1  --   GLUCOSE 156*  --  123*    Labs and medications reviewed.   No nutrition interventions warranted at this time. If nutrition issues arise, please consult RD.   Royann ShiversLynn Carrell Palmatier MS,RD,CSG,LDN Office: [phone] Pager: [phone]

## 2015-08-09 DIAGNOSIS — D72829 Elevated white blood cell count, unspecified: Secondary | ICD-10-CM

## 2015-08-09 DIAGNOSIS — E876 Hypokalemia: Secondary | ICD-10-CM

## 2015-08-09 MED ORDER — LEVOFLOXACIN 750 MG PO TABS: 750.0000 mg | ORAL_TABLET | Freq: Every day | ORAL | Status: DC

## 2015-08-09 MED ORDER — BENZONATATE 100 MG PO CAPS: 100.0000 mg | ORAL_CAPSULE | Freq: Three times a day (TID) | ORAL | Status: DC | PRN

## 2015-08-09 NOTE — Discharge Instructions (Signed)

## 2015-08-09 NOTE — Care Management Note (Signed)
Case Management Note  Patient Details  Name: Danielle [name] MRN: [id number] Date of Birth: 08/14/1965   Expected Discharge Date:     08/09/2015             Expected Discharge Plan:  Home w Home Health Services  In-House Referral:  Financial Counselor  Discharge planning Services  CM Consult  Post Acute Care Choice:  NA Choice offered to:  NA  DME Arranged:    DME Agency:     HH Arranged:    HH Agency:     Status of Service: complete  If discussed at [employer] Meetings, dates discussed:    Additional Comments: Pt discharging home with self care today. [facility]. No further CM needs.   Malcolm Metrohildress, Tedra Coppernoll Demske, RN 08/09/2015, 9:34 AM

## 2015-08-09 NOTE — Progress Notes (Signed)
ANTIBIOTIC CONSULT NOTE  Pharmacy Consult for Renal Adjustment of ABX if needed  Allergies  Allergen Reactions  . Penicillins Rash    Has patient had a PCN reaction causing immediate rash, facial/tongue/throat swelling, SOB or lightheadedness with hypotension: Yesyes Has patient had a PCN reaction causing severe rash involving mucus membranes or skin necrosis: No Has patient had a PCN reaction that required hospitalization No Has patient had a PCN reaction occurring within the last 10 years: No If all of the above answers are "NO", then may proceed with Cephalosporin use.    Patient Measurements: Height: 5\' 2"  (157.5 cm) Weight: 204 lb 11.2 oz (92.851 kg) IBW/kg (Calculated) : 50.1  Vital Signs: Temp: 98.2 F (36.8 C) (07/14 0614) Temp Source: Oral (07/14 0614) BP: 135/81 mmHg (07/14 0614) Pulse Rate: 67 (07/14 0614)  Labs:  Recent Labs  08/07/15 1300 08/08/15 0542  WBC 17.0* 19.1*  HGB 11.4* 10.6*  PLT 372 383  CREATININE 0.74 0.51   No results for input(s): VANCOTROUGH, VANCOPEAK, VANCORANDOM, GENTTROUGH, GENTPEAK, GENTRANDOM, TOBRATROUGH, TOBRAPEAK, TOBRARND, AMIKACINPEAK, AMIKACINTROU, AMIKACIN in the last 72 hours.   Medical History: Past Medical History  Diagnosis Date  . Hypertension    Assessment: 50yo female with suspected CAP.  Pt started on Rocephin and Zithromax.  No renal adjustment needed.    Estimated Creatinine Clearance: 92.2 mL/min (by C-G formula based on Cr of 0.51).  Plan: Continue current Rx  Wayland DenisHall, Elly Haffey A, [facility] CenterRPH 08/09/2015

## 2015-08-09 NOTE — Discharge Summary (Signed)
Physician Discharge Summary  Danielle [name] ZOX:[id number] DOB: November 27, 1965 DOA: 08/07/2015  PCP: Danielle Loffler, [name]  Admit date: 08/07/2015 Discharge date: 08/09/2015  Time spent: 45 minutes  Recommendations for Outpatient Follow-up:  -Will be discharged home today. -Advised to follow up with PCP in 2 weeks. -Should have a repeat CXR in 4-6 weeks to ensure complete resolution of PNA.   Discharge Diagnoses:  Principal Problem:   CAP (community acquired pneumonia) Active Problems:   Acute respiratory failure with hypoxia (HCC)   SIRS (systemic inflammatory response syndrome) (HCC)   Leukocytosis   Hypokalemia   Discharge Condition: Stable and improved  Filed Weights   08/07/15 1216 08/07/15 1610  Weight: 93.895 kg (207 lb) 92.851 kg (204 lb 11.2 oz)    History of present illness:  Danielle [name] is a 50 y.o. female with history of hypertension presents to the hospital today with a one-week history of shortness of breath and cough. She states that she started with a sinus infection about a week ago, did not seek medical attention. Over the past 3 day shortness of breath has progressed, she also has a cough productive of green sputum. Denies any fevers or chills. She checked her oxygen level at home and was found to be 80%, she came to the hospital for evaluation and management. In the ED she was found to have an oxygen level of low 90s on room air, a potassium level of 2.9, WBCs count of 17, a chest x-ray showed diffuse bilateral interstitial airspace opacities question diffuse infection/pneumonia versus edema. Admission has been requested for further evaluation and management.  Hospital Course:   Acute hypoxemic respiratory failure -On account of community-acquired pneumonia. -Culture data remains negative to date. -She has now been weaned off oxygen. -She appears clinically improved. -Will DC home on an 8 day course of levaquin.  Hypokalemia -Replaced. -Magnesium level  is normal at 2.1.  Procedures:  None   Consultations:  None  Discharge Instructions  Discharge Instructions    Diet - low sodium heart healthy    Complete by:  As directed      Increase activity slowly    Complete by:  As directed             Medication List    STOP taking these medications        GOODY HEADACHE PO     ibuprofen 200 MG tablet  Commonly known as:  ADVIL,MOTRIN     methocarbamol 500 MG tablet  Commonly known as:  ROBAXIN      TAKE these medications        benzonatate 100 MG capsule  Commonly known as:  TESSALON  Take 1 capsule (100 mg total) by mouth 3 (three) times daily as needed for cough.     HYDROcodone-acetaminophen 7.5-325 MG tablet  Commonly known as:  NORCO  Take 1 tablet by mouth every 4 (four) hours as needed for moderate pain (Must last 30 days.  Do not drive or operate machinery while taking this medicine.).     levofloxacin 750 MG tablet  Commonly known as:  LEVAQUIN  Take 1 tablet (750 mg total) by mouth daily.     lisinopril-hydrochlorothiazide 20-12.5 MG tablet  Commonly known as:  PRINZIDE,ZESTORETIC  Take 1 tablet by mouth daily. Reported on 08/01/2015       Allergies  Allergen Reactions  . Penicillins Rash    Has patient had a PCN reaction causing immediate rash, facial/tongue/throat swelling, SOB  or lightheadedness with hypotension: Yesyes Has patient had a PCN reaction causing severe rash involving mucus membranes or skin necrosis: No Has patient had a PCN reaction that required hospitalization No Has patient had a PCN reaction occurring within the last 10 years: No If all of the above answers are "NO", then may proceed with Cephalosporin use.        Follow-up Information    Follow up with Danielle LofflerJones, Angel S, [name]. Schedule an appointment as soon as possible for a visit in 2 weeks.   Specialty:  Physician Assistant   Contact information:   [address] [place] [id number] [phone]        The results of  significant diagnostics from this hospitalization (including imaging, microbiology, ancillary and laboratory) are listed below for reference.    Significant Diagnostic Studies: Dg Chest 2 View  08/07/2015  CLINICAL DATA:  50 year old female with cough, congestion and shortness of breath for 1 week. EXAM: CHEST  2 VIEW COMPARISON:  None. FINDINGS: Diffuse bilateral interstitial and airspace opacities are noted. Cardiomegaly identified. There is no evidence of pleural effusion or pneumothorax. No acute or suspicious bony abnormalities are identified. IMPRESSION: Diffuse bilateral interstitial and airspace opacities-question diffuse infection/pneumonia versus edema. Cardiomegaly. Electronically Signed   By: Harmon PierJeffrey  Hu M.D.   On: 08/07/2015 12:59    Microbiology: Recent Results (from the past 240 hour(s))  Culture, blood (Routine X 2) w Reflex to ID Panel     Status: None (Preliminary result)   Collection Time: 08/07/15  1:49 PM  Result Value Ref Range Status   Specimen Description BLOOD LEFT HAND DRAWN BY RN  Final   Special Requests BOTTLES DRAWN AEROBIC ONLY 10CC  Final   Culture NO GROWTH < 24 HOURS  Final   Report Status PENDING  Incomplete  Culture, blood (Routine X 2) w Reflex to ID Panel     Status: None (Preliminary result)   Collection Time: 08/07/15  1:49 PM  Result Value Ref Range Status   Specimen Description BLOOD RIGHT ANTECUBITAL  Final   Special Requests BOTTLES DRAWN AEROBIC AND ANAEROBIC 8CC EACH  Final   Culture NO GROWTH < 24 HOURS  Final   Report Status PENDING  Incomplete     Labs: Basic Metabolic Panel:  Recent Labs Lab 08/07/15 1300 08/07/15 1756 08/08/15 0542  NA 137  --  139  K 2.9*  --  4.3  CL 101  --  106  CO2 26  --  28  GLUCOSE 156*  --  123*  BUN 8  --  13  CREATININE 0.74  --  0.51  CALCIUM 8.4*  --  8.3*  MG  --  2.1  --    Liver Function Tests: No results for input(s): AST, ALT, ALKPHOS, BILITOT, PROT, ALBUMIN in the last 168 hours. No  results for input(s): LIPASE, AMYLASE in the last 168 hours. No results for input(s): AMMONIA in the last 168 hours. CBC:  Recent Labs Lab 08/07/15 1300 08/08/15 0542  WBC 17.0* 19.1*  NEUTROABS 15.5*  --   HGB 11.4* 10.6*  HCT 33.9* 32.5*  MCV 90.4 92.3  PLT 372 383   Cardiac Enzymes: No results for input(s): CKTOTAL, CKMB, CKMBINDEX, TROPONINI in the last 168 hours. BNP: BNP (last 3 results)  Recent Labs  08/07/15 1300  BNP 99.0    ProBNP (last 3 results) No results for input(s): PROBNP in the last 8760 hours.  CBG: No results for input(s): GLUCAP in the last  168 hours.     SignedChaya Jan  [facility] Pager: [phone] 08/09/2015, 11:58 AM

## 2015-08-09 NOTE — Progress Notes (Signed)
Discharged PT per MD order and protocol. Discharge handouts reviewed/explained. Education completed. Prescriptions given and explained to pt.   Pt verbalized understanding and left with all belongings. VSS. IV catheter D/C.  Patient wheeled down by staff member.

## 2015-08-12 LAB — CULTURE, BLOOD (ROUTINE X 2)
Culture: NO GROWTH
Culture: NO GROWTH

## 2015-09-03 ENCOUNTER — Telehealth: Payer: Self-pay | Admitting: Orthopaedic Surgery

## 2015-09-03 MED ORDER — HYDROCODONE-ACETAMINOPHEN 7.5-325 MG PO TABS: 1.0000 | ORAL_TABLET | ORAL | 0 refills | Status: DC | PRN

## 2015-09-03 NOTE — Telephone Encounter (Signed)
Hydrocodone-Acetaminophen 7.5/325mg Qty 120 Tablets °

## 2015-10-03 ENCOUNTER — Encounter: Payer: Self-pay | Admitting: Orthopaedic Surgery

## 2015-10-03 ENCOUNTER — Ambulatory Visit (INDEPENDENT_AMBULATORY_CARE_PROVIDER_SITE_OTHER): Payer: Self-pay | Admitting: Orthopaedic Surgery

## 2015-10-03 VITALS — BP 161/80 | HR 108 | Temp 97.9°F | Ht 61.0 in | Wt 204.0 lb

## 2015-10-03 DIAGNOSIS — M25562 Pain in left knee: Secondary | ICD-10-CM

## 2015-10-03 DIAGNOSIS — F17200 Nicotine dependence, unspecified, uncomplicated: Secondary | ICD-10-CM

## 2015-10-03 DIAGNOSIS — Z720 Tobacco use: Secondary | ICD-10-CM

## 2015-10-03 MED ORDER — HYDROCODONE-ACETAMINOPHEN 7.5-325 MG PO TABS: 1.0000 | ORAL_TABLET | Freq: Four times a day (QID) | ORAL | 0 refills | Status: DC | PRN

## 2015-10-03 NOTE — Patient Instructions (Signed)
Smoking Cessation, Tips for Success If you are ready to quit smoking, congratulations! You have chosen to help yourself be healthier. Cigarettes bring nicotine, tar, carbon monoxide, and other irritants into your body. Your lungs, heart, and blood vessels will be able to work better without these poisons. There are many different ways to quit smoking. Nicotine gum, nicotine patches, a nicotine inhaler, or nicotine nasal spray can help with physical craving. Hypnosis, support groups, and medicines help break the habit of smoking. WHAT THINGS CAN I DO TO MAKE QUITTING EASIER?  Here are some tips to help you quit for good:  Pick a date when you will quit smoking completely. Tell all of your friends and family about your plan to quit on that date.  Do not try to slowly cut down on the number of cigarettes you are smoking. Pick a quit date and quit smoking completely starting on that day.  Throw away all cigarettes.   Clean and remove all ashtrays from your home, work, and car.  On a card, write down your reasons for quitting. Carry the card with you and read it when you get the urge to smoke.  Cleanse your body of nicotine. Drink enough water and fluids to keep your urine clear or pale yellow. Do this after quitting to flush the nicotine from your body.  Learn to predict your moods. Do not let a bad situation be your excuse to have a cigarette. Some situations in your life might tempt you into wanting a cigarette.  Never have "just one" cigarette. It leads to wanting another and another. Remind yourself of your decision to quit.  Change habits associated with smoking. If you smoked while driving or when feeling stressed, try other activities to replace smoking. Stand up when drinking your coffee. Brush your teeth after eating. Sit in a different chair when you read the paper. Avoid alcohol while trying to quit, and try to drink fewer caffeinated beverages. Alcohol and caffeine may urge you to  smoke.  Avoid foods and drinks that can trigger a desire to smoke, such as sugary or spicy foods and alcohol.  Ask people who smoke not to smoke around you.  Have something planned to do right after eating or having a cup of coffee. For example, plan to take a walk or exercise.  Try a relaxation exercise to calm you down and decrease your stress. Remember, you may be tense and nervous for the first 2 weeks after you quit, but this will pass.  Find new activities to keep your hands busy. Play with a pen, coin, or rubber band. Doodle or draw things on paper.  Brush your teeth right after eating. This will help cut down on the craving for the taste of tobacco after meals. You can also try mouthwash.   Use oral substitutes in place of cigarettes. Try using lemon drops, carrots, cinnamon sticks, or chewing gum. Keep them handy so they are available when you have the urge to smoke.  When you have the urge to smoke, try deep breathing.  Designate your home as a nonsmoking area.  If you are a heavy smoker, ask your health care provider about a prescription for nicotine chewing gum. It can ease your withdrawal from nicotine.  Reward yourself. Set aside the cigarette money you save and buy yourself something nice.  Look for support from others. Join a support group or smoking cessation program. Ask someone at home or at work to help you with your plan   to quit smoking.  Always ask yourself, "Do I need this cigarette or is this just a reflex?" Tell yourself, "Today, I choose not to smoke," or "I do not want to smoke." You are reminding yourself of your decision to quit.  Do not replace cigarette smoking with electronic cigarettes (commonly called e-cigarettes). The safety of e-cigarettes is unknown, and some may contain harmful chemicals.  If you relapse, do not give up! Plan ahead and think about what you will do the next time you get the urge to smoke. HOW WILL I FEEL WHEN I QUIT SMOKING? You  may have symptoms of withdrawal because your body is used to nicotine (the addictive substance in cigarettes). You may crave cigarettes, be irritable, feel very hungry, cough often, get headaches, or have difficulty concentrating. The withdrawal symptoms are only temporary. They are strongest when you first quit but will go away within 10-14 days. When withdrawal symptoms occur, stay in control. Think about your reasons for quitting. Remind yourself that these are signs that your body is healing and getting used to being without cigarettes. Remember that withdrawal symptoms are easier to treat than the major diseases that smoking can cause.  Even after the withdrawal is over, expect periodic urges to smoke. However, these cravings are generally short lived and will go away whether you smoke or not. Do not smoke! WHAT RESOURCES ARE AVAILABLE TO HELP ME QUIT SMOKING? Your health care provider can direct you to community resources or hospitals for support, which may include:  Group support.  Education.  Hypnosis.  Therapy.   This information is not intended to replace advice given to you by your health care provider. Make sure you discuss any questions you have with your health care provider.   Document Released: 10/11/2003 Document Revised: 02/02/2014 Document Reviewed: 06/30/2012 Elsevier Interactive Patient Education 2016 [employer] Inc[employer]  

## 2015-10-03 NOTE — Progress Notes (Signed)
CC:  I have pain of my left knee. I would like an injection.  The patient has chronic pain of the left knee.  There is no recent trauma.  There is no redness.  Injections in the past have helped.  The knee has no redness, has an effusion and crepitus present.  ROM of the left knee is 0-105.  Impression:  Chronic knee pain left  Return: 2 months  PROCEDURE NOTE:  The patient requests injections of the lft knee, verbal consent was obtained.  The left knee was prepped appropriately after time out was performed.   Sterile technique was observed and injection of 1 cc of Depo-Medrol 40 mg with several cc's of plain xylocaine. Anesthesia was provided by ethyl chloride and a 20-gauge needle was used to inject the knee area. The injection was tolerated well.  A band aid dressing was applied.  The patient was advised to apply ice later today and tomorrow to the injection sight as needed.  Electronically Signed Darreld McleanWayne Dario Yono, MD 9/7/20172:27 PM

## 2015-10-31 ENCOUNTER — Other Ambulatory Visit: Payer: Self-pay | Admitting: *Deleted

## 2015-10-31 ENCOUNTER — Telehealth: Payer: Self-pay | Admitting: Orthopaedic Surgery

## 2015-10-31 MED ORDER — HYDROCODONE-ACETAMINOPHEN 7.5-325 MG PO TABS: 1.0000 | ORAL_TABLET | Freq: Four times a day (QID) | ORAL | 0 refills | Status: DC | PRN

## 2015-10-31 NOTE — Telephone Encounter (Signed)
Hydrocodone-Acetaminophen  7.5/325mg Qty 110 Tablets ° °Take 1 tablet by mouth every 6(six) hours as needed for moderate pain (Must last 30 days. Do not drive or operate machinery while taking this medicine.). °

## 2015-11-04 ENCOUNTER — Other Ambulatory Visit: Payer: Self-pay | Admitting: *Deleted

## 2015-11-04 MED ORDER — HYDROCODONE-ACETAMINOPHEN 7.5-325 MG PO TABS: 1.0000 | ORAL_TABLET | Freq: Four times a day (QID) | ORAL | 0 refills | Status: DC | PRN

## 2015-12-03 ENCOUNTER — Ambulatory Visit: Payer: Self-pay | Admitting: Orthopaedic Surgery

## 2015-12-05 ENCOUNTER — Ambulatory Visit (INDEPENDENT_AMBULATORY_CARE_PROVIDER_SITE_OTHER): Payer: Self-pay | Admitting: Orthopaedic Surgery

## 2015-12-05 ENCOUNTER — Encounter: Payer: Self-pay | Admitting: Orthopaedic Surgery

## 2015-12-05 DIAGNOSIS — F17200 Nicotine dependence, unspecified, uncomplicated: Secondary | ICD-10-CM

## 2015-12-05 DIAGNOSIS — G8929 Other chronic pain: Secondary | ICD-10-CM

## 2015-12-05 DIAGNOSIS — M25562 Pain in left knee: Secondary | ICD-10-CM

## 2015-12-05 MED ORDER — HYDROCODONE-ACETAMINOPHEN 7.5-325 MG PO TABS: 1.0000 | ORAL_TABLET | Freq: Four times a day (QID) | ORAL | 0 refills | Status: DC | PRN

## 2015-12-05 NOTE — Patient Instructions (Signed)
Smoking Cessation, Tips for Success If you are ready to quit smoking, congratulations! You have chosen to help yourself be healthier. Cigarettes bring nicotine, tar, carbon monoxide, and other irritants into your body. Your lungs, heart, and blood vessels will be able to work better without these poisons. There are many different ways to quit smoking. Nicotine gum, nicotine patches, a nicotine inhaler, or nicotine nasal spray can help with physical craving. Hypnosis, support groups, and medicines help break the habit of smoking. WHAT THINGS CAN I DO TO MAKE QUITTING EASIER?  Here are some tips to help you quit for good:  Pick a date when you will quit smoking completely. Tell all of your friends and family about your plan to quit on that date.  Do not try to slowly cut down on the number of cigarettes you are smoking. Pick a quit date and quit smoking completely starting on that day.  Throw away all cigarettes.   Clean and remove all ashtrays from your home, work, and car.  On a card, write down your reasons for quitting. Carry the card with you and read it when you get the urge to smoke.  Cleanse your body of nicotine. Drink enough water and fluids to keep your urine clear or pale yellow. Do this after quitting to flush the nicotine from your body.  Learn to predict your moods. Do not let a bad situation be your excuse to have a cigarette. Some situations in your life might tempt you into wanting a cigarette.  Never have "just one" cigarette. It leads to wanting another and another. Remind yourself of your decision to quit.  Change habits associated with smoking. If you smoked while driving or when feeling stressed, try other activities to replace smoking. Stand up when drinking your coffee. Brush your teeth after eating. Sit in a different chair when you read the paper. Avoid alcohol while trying to quit, and try to drink fewer caffeinated beverages. Alcohol and caffeine may urge you to  smoke.  Avoid foods and drinks that can trigger a desire to smoke, such as sugary or spicy foods and alcohol.  Ask people who smoke not to smoke around you.  Have something planned to do right after eating or having a cup of coffee. For example, plan to take a walk or exercise.  Try a relaxation exercise to calm you down and decrease your stress. Remember, you may be tense and nervous for the first 2 weeks after you quit, but this will pass.  Find new activities to keep your hands busy. Play with a pen, coin, or rubber band. Doodle or draw things on paper.  Brush your teeth right after eating. This will help cut down on the craving for the taste of tobacco after meals. You can also try mouthwash.   Use oral substitutes in place of cigarettes. Try using lemon drops, carrots, cinnamon sticks, or chewing gum. Keep them handy so they are available when you have the urge to smoke.  When you have the urge to smoke, try deep breathing.  Designate your home as a nonsmoking area.  If you are a heavy smoker, ask your health care provider about a prescription for nicotine chewing gum. It can ease your withdrawal from nicotine.  Reward yourself. Set aside the cigarette money you save and buy yourself something nice.  Look for support from others. Join a support group or smoking cessation program. Ask someone at home or at work to help you with your plan   to quit smoking.  Always ask yourself, "Do I need this cigarette or is this just a reflex?" Tell yourself, "Today, I choose not to smoke," or "I do not want to smoke." You are reminding yourself of your decision to quit.  Do not replace cigarette smoking with electronic cigarettes (commonly called e-cigarettes). The safety of e-cigarettes is unknown, and some may contain harmful chemicals.  If you relapse, do not give up! Plan ahead and think about what you will do the next time you get the urge to smoke. HOW WILL I FEEL WHEN I QUIT SMOKING? You  may have symptoms of withdrawal because your body is used to nicotine (the addictive substance in cigarettes). You may crave cigarettes, be irritable, feel very hungry, cough often, get headaches, or have difficulty concentrating. The withdrawal symptoms are only temporary. They are strongest when you first quit but will go away within 10-14 days. When withdrawal symptoms occur, stay in control. Think about your reasons for quitting. Remind yourself that these are signs that your body is healing and getting used to being without cigarettes. Remember that withdrawal symptoms are easier to treat than the major diseases that smoking can cause.  Even after the withdrawal is over, expect periodic urges to smoke. However, these cravings are generally short lived and will go away whether you smoke or not. Do not smoke! WHAT RESOURCES ARE AVAILABLE TO HELP ME QUIT SMOKING? Your health care provider can direct you to community resources or hospitals for support, which may include:  Group support.  Education.  Hypnosis.  Therapy.   This information is not intended to replace advice given to you by your health care provider. Make sure you discuss any questions you have with your health care provider.   Document Released: 10/11/2003 Document Revised: 02/02/2014 Document Reviewed: 06/30/2012 Elsevier Interactive Patient Education 2016 [employer] Inc[employer]  

## 2015-12-05 NOTE — Progress Notes (Signed)
Patient ZO:XWRUEA:Danielle [name], female DOB:01/18/1966, 50 y.o. [id number]  Chief Complaint  Patient presents with  . Follow-up    chronic knee pain    HPI  Danielle [name] is a 50 y.o. female who has chronic pain of the left knee. She is a little better today. She has swelling but no giving way or locking.  She is active. HPI  There is no height or weight on file to calculate BMI.  ROS  Review of Systems  HENT: Negative for congestion.   Respiratory: Negative for cough and shortness of breath.   Cardiovascular: Negative for chest pain and leg swelling.  Endocrine: Positive for cold intolerance.  Musculoskeletal: Positive for arthralgias, gait problem, joint swelling and myalgias.  Allergic/Immunologic: Negative for environmental allergies.    Past Medical History:  Diagnosis Date  . Hypertension     Past Surgical History:  Procedure Laterality Date  . arm surgery      No family history on file.  Social History Social History  Substance Use Topics  . Smoking status: Current Every Day Smoker    Packs/day: 1.00    Types: Cigarettes  . Smokeless tobacco: Not on file  . Alcohol use Yes     Comment: occassionally    Allergies  Allergen Reactions  . Penicillins Rash    Has patient had a PCN reaction causing immediate rash, facial/tongue/throat swelling, SOB or lightheadedness with hypotension: Yesyes Has patient had a PCN reaction causing severe rash involving mucus membranes or skin necrosis: No Has patient had a PCN reaction that required hospitalization No Has patient had a PCN reaction occurring within the last 10 years: No If all of the above answers are "NO", then may proceed with Cephalosporin use.     Current Outpatient Prescriptions  Medication Sig Dispense Refill  . benzonatate (TESSALON) 100 MG capsule Take 1 capsule (100 mg total) by mouth 3 (three) times daily as needed for cough. 20 capsule 0  . HYDROcodone-acetaminophen (NORCO) 7.5-325 MG tablet Take  1 tablet by mouth every 6 (six) hours as needed for moderate pain (Must last 30 days.Do not drive or operate machinery while taking this medicine.). 100 tablet 0  . levofloxacin (LEVAQUIN) 750 MG tablet Take 1 tablet (750 mg total) by mouth daily. 8 tablet 0  . lisinopril-hydrochlorothiazide (PRINZIDE,ZESTORETIC) 20-12.5 MG per tablet Take 1 tablet by mouth daily. Reported on 08/01/2015     No current facility-administered medications for this visit.      Physical Exam  There were no vitals taken for this visit.  Constitutional: overall normal hygiene, normal nutrition, well developed, normal grooming, normal body habitus. Assistive device:none  Musculoskeletal: gait and station Limp left, muscle tone and strength are normal, no tremors or atrophy is present.  .  Neurological: coordination overall normal.  Deep tendon reflex/nerve stretch intact.  Sensation normal.  Cranial nerves II-XII intact.   Skin:   Normal overall no scars, lesions, ulcers or rashes. No psoriasis.  Psychiatric: Alert and oriented x 3.  Recent memory intact, remote memory unclear.  Normal mood and affect. Well groomed.  Good eye contact.  Cardiovascular: overall no swelling, no varicosities, no edema bilaterally, normal temperatures of the legs and arms, no clubbing, cyanosis and good capillary refill.  Lymphatic: palpation is normal.  The left lower extremity is examined:  Inspection:  Thigh:  Non-tender and no defects  Knee has swelling 1+ effusion.  Joint tenderness is present                        Patient is tender over the medial joint line  Lower Leg:  Has normal appearance and no tenderness or defects  Ankle:  Non-tender and no defects  Foot:  Non-tender and no defects Range of Motion:  Knee:  Range of motion is: 0-110                        Crepitus is  present  Ankle:  Range of motion is normal. Strength and Tone:  The left lower extremity has normal strength and  tone. Stability:  Knee:  The knee is stable.  Ankle:  The ankle is stable.    The patient has been educated about the nature of the problem(s) and counseled on treatment options.  The patient appeared to understand what I have discussed and is in agreement with it.  Encounter Diagnoses  Name Primary?  . Chronic pain of left knee Yes  . Tobacco smoker within last 12 months     PLAN Call if any problems.  Precautions discussed.  Continue current medications.   Return to clinic 3 months   Electronically Signed Darreld McleanWayne Branson Kranz, MD 11/9/20179:00 AM

## 2015-12-06 ENCOUNTER — Telehealth: Payer: Self-pay | Admitting: Physician Assistant

## 2015-12-06 NOTE — Telephone Encounter (Signed)
Pt reported

## 2016-01-07 ENCOUNTER — Telehealth: Payer: Self-pay | Admitting: Orthopaedic Surgery

## 2016-01-07 MED ORDER — HYDROCODONE-ACETAMINOPHEN 7.5-325 MG PO TABS: 1.0000 | ORAL_TABLET | Freq: Four times a day (QID) | ORAL | 0 refills | Status: DC | PRN

## 2016-01-07 NOTE — Telephone Encounter (Signed)
Hydrocodone-Acetaminophen  7.5/325mg  Qty 100 Tablets °

## 2016-02-06 ENCOUNTER — Telehealth: Payer: Self-pay | Admitting: Orthopaedic Surgery

## 2016-02-06 ENCOUNTER — Other Ambulatory Visit: Payer: Self-pay | Admitting: *Deleted

## 2016-02-06 MED ORDER — HYDROCODONE-ACETAMINOPHEN 7.5-325 MG PO TABS: 1.0000 | ORAL_TABLET | Freq: Four times a day (QID) | ORAL | 0 refills | Status: DC | PRN

## 2016-02-06 NOTE — Telephone Encounter (Signed)
Dr. Sanjuan DameKeeling's patient requests a refill on Hydrocodone/Acetaminophen (Norco)   7.5-325 mgs.  Qty  90  Sig: Take 1 tablet by mouth every 6 (six) hours as needed for moderate pain (Must last 30 days.Do not drive or operate machinery while taking this medicine.).

## 2016-03-05 ENCOUNTER — Ambulatory Visit: Payer: Self-pay | Admitting: Orthopaedic Surgery

## 2016-03-12 ENCOUNTER — Ambulatory Visit (INDEPENDENT_AMBULATORY_CARE_PROVIDER_SITE_OTHER): Payer: Self-pay | Admitting: Orthopaedic Surgery

## 2016-03-12 ENCOUNTER — Encounter: Payer: Self-pay | Admitting: Orthopaedic Surgery

## 2016-03-12 VITALS — BP 202/87 | HR 96 | Temp 97.3°F | Ht 61.0 in | Wt 192.0 lb

## 2016-03-12 DIAGNOSIS — F17200 Nicotine dependence, unspecified, uncomplicated: Secondary | ICD-10-CM

## 2016-03-12 DIAGNOSIS — M25562 Pain in left knee: Secondary | ICD-10-CM

## 2016-03-12 DIAGNOSIS — G8929 Other chronic pain: Secondary | ICD-10-CM

## 2016-03-12 MED ORDER — HYDROCODONE-ACETAMINOPHEN 7.5-325 MG PO TABS: 1.0000 | ORAL_TABLET | Freq: Four times a day (QID) | ORAL | 0 refills | Status: DC | PRN

## 2016-03-12 NOTE — Patient Instructions (Signed)
Steps to Quit Smoking Smoking tobacco can be bad for your health. It can also affect almost every organ in your body. Smoking puts you and people around you at risk for many serious long-lasting (chronic) diseases. Quitting smoking is hard, but it is one of the best things that you can do for your health. It is never too late to quit. What are the benefits of quitting smoking? When you quit smoking, you lower your risk for getting serious diseases and conditions. They can include:  Lung cancer or lung disease.  Heart disease.  Stroke.  Heart attack.  Not being able to have children (infertility).  Weak bones (osteoporosis) and broken bones (fractures). If you have coughing, wheezing, and shortness of breath, those symptoms may get better when you quit. You may also get sick less often. If you are pregnant, quitting smoking can help to lower your chances of having a baby of low birth weight. What can I do to help me quit smoking? Talk with your doctor about what can help you quit smoking. Some things you can do (strategies) include:  Quitting smoking totally, instead of slowly cutting back how much you smoke over a period of time.  Going to in-person counseling. You are more likely to quit if you go to many counseling sessions.  Using resources and support systems, such as:  Online chats with a counselor.  Phone quitlines.  Printed self-help materials.  Support groups or group counseling.  Text messaging programs.  Mobile phone apps or applications.  Taking medicines. Some of these medicines may have nicotine in them. If you are pregnant or breastfeeding, do not take any medicines to quit smoking unless your doctor says it is okay. Talk with your doctor about counseling or other things that can help you. Talk with your doctor about using more than one strategy at the same time, such as taking medicines while you are also going to in-person counseling. This can help make quitting  easier. What things can I do to make it easier to quit? Quitting smoking might feel very hard at first, but there is a lot that you can do to make it easier. Take these steps:  Talk to your family and friends. Ask them to support and encourage you.  Call phone quitlines, reach out to support groups, or work with a counselor.  Ask people who smoke to not smoke around you.  Avoid places that make you want (trigger) to smoke, such as:  Bars.  Parties.  Smoke-break areas at work.  Spend time with people who do not smoke.  Lower the stress in your life. Stress can make you want to smoke. Try these things to help your stress:  Getting regular exercise.  Deep-breathing exercises.  Yoga.  Meditating.  Doing a body scan. To do this, close your eyes, focus on one area of your body at a time from head to toe, and notice which parts of your body are tense. Try to relax the muscles in those areas.  Download or buy apps on your mobile phone or tablet that can help you stick to your quit plan. There are many free apps, such as QuitGuide from the CDC (Centers for Disease Control and Prevention). You can find more support from smokefree.gov and other websites. This information is not intended to replace advice given to you by your health care provider. Make sure you discuss any questions you have with your health care provider. Document Released: 11/08/2008 Document Revised: 09/10/2015 Document   Reviewed: 05/29/2014 Elsevier Interactive Patient Education  2017 Elsevier Inc.  

## 2016-03-12 NOTE — Progress Notes (Signed)
Patient [name], female DOB:02/13/1965, 51 y.o. [id number]  Chief Complaint  Patient presents with  . Follow-up    left knee pain    HPI  Danielle [name] is a 51 y.o. female who has chronic pain of the left knee.  She has no new trauma.  She has swelling and popping.  She has no redness. HPI  Body mass index is 36.28 kg/m.  ROS  Review of Systems  HENT: Negative for congestion.   Respiratory: Negative for cough and shortness of breath.   Cardiovascular: Negative for chest pain and leg swelling.  Endocrine: Positive for cold intolerance.  Musculoskeletal: Positive for arthralgias, gait problem, joint swelling and myalgias.  Allergic/Immunologic: Negative for environmental allergies.    Past Medical History:  Diagnosis Date  . Hypertension     Past Surgical History:  Procedure Laterality Date  . arm surgery      No family history on file.  Social History Social History  Substance Use Topics  . Smoking status: Current Every Day Smoker    Packs/day: 1.00    Types: Cigarettes  . Smokeless tobacco: Never Used  . Alcohol use Yes     Comment: occassionally    Allergies  Allergen Reactions  . Penicillins Rash    Has patient had a PCN reaction causing immediate rash, facial/tongue/throat swelling, SOB or lightheadedness with hypotension: Yesyes Has patient had a PCN reaction causing severe rash involving mucus membranes or skin necrosis: No Has patient had a PCN reaction that required hospitalization No Has patient had a PCN reaction occurring within the last 10 years: No If all of the above answers are "NO", then may proceed with Cephalosporin use.     Current Outpatient Prescriptions  Medication Sig Dispense Refill  . benzonatate (TESSALON) 100 MG capsule Take 1 capsule (100 mg total) by mouth 3 (three) times daily as needed for cough. 20 capsule 0  . HYDROcodone-acetaminophen (NORCO) 7.5-325 MG tablet Take 1 tablet by mouth every 6 (six) hours as  needed for moderate pain (Must last 30 days.Do not drive or operate machinery while taking this medicine.). 85 tablet 0  . levofloxacin (LEVAQUIN) 750 MG tablet Take 1 tablet (750 mg total) by mouth daily. 8 tablet 0  . lisinopril-hydrochlorothiazide (PRINZIDE,ZESTORETIC) 20-12.5 MG per tablet Take 1 tablet by mouth daily. Reported on 08/01/2015     No current facility-administered medications for this visit.      Physical Exam  Blood pressure (!) 202/87, pulse 96, temperature 97.3 F (36.3 C), height 5\' 1"  (1.549 m), weight 192 lb (87.1 kg).  Constitutional: overall normal hygiene, normal nutrition, well developed, normal grooming, normal body habitus. Assistive device:none  Musculoskeletal: gait and station Limp left, muscle tone and strength are normal, no tremors or atrophy is present.  .  Neurological: coordination overall normal.  Deep tendon reflex/nerve stretch intact.  Sensation normal.  Cranial nerves II-XII intact.   Skin:   Normal overall no scars, lesions, ulcers or rashes. No psoriasis.  Psychiatric: Alert and oriented x 3.  Recent memory intact, remote memory unclear.  Normal mood and affect. Well groomed.  Good eye contact.  Cardiovascular: overall no swelling, no varicosities, no edema bilaterally, normal temperatures of the legs and arms, no clubbing, cyanosis and good capillary refill.  Lymphatic: palpation is normal.  The left lower extremity is examined:  Inspection:  Thigh:  Non-tender and no defects  Knee has swelling 1+ effusion.  Joint tenderness is present                        Patient is tender over the medial joint line  Lower Leg:  Has normal appearance and no tenderness or defects  Ankle:  Non-tender and no defects  Foot:  Non-tender and no defects Range of Motion:  Knee:  Range of motion is: 0-110                        Crepitus is  present  Ankle:  Range of motion is normal. Strength and Tone:  The left lower extremity  has normal strength and tone. Stability:  Knee:  The knee is stable.  Ankle:  The ankle is stable.    The patient has been educated about the nature of the problem(s) and counseled on treatment options.  The patient appeared to understand what I have discussed and is in agreement with it.  Encounter Diagnoses  Name Primary?  . Chronic pain of left knee Yes  . Tobacco smoker within last 12 months     PLAN Call if any problems.  Precautions discussed.  Continue current medications.   Return to clinic 3 months   I have reviewed the [facility] Controlled Substance Reporting System web site prior to prescribing narcotic medicine for this patient.  Electronically Signed Darreld Mclean, MD 2/15/20189:54 AM

## 2016-04-09 ENCOUNTER — Telehealth: Payer: Self-pay | Admitting: Orthopaedic Surgery

## 2016-04-09 MED ORDER — HYDROCODONE-ACETAMINOPHEN 7.5-325 MG PO TABS: 1.0000 | ORAL_TABLET | Freq: Four times a day (QID) | ORAL | 0 refills | Status: DC | PRN

## 2016-04-09 NOTE — Telephone Encounter (Signed)
Hydrocodone-Acetaminophen  7.5/325 mg  Qty 85 Tablets       Advised patient to sign up for My Chart to put in for prescriptions.

## 2016-04-15 ENCOUNTER — Ambulatory Visit (INDEPENDENT_AMBULATORY_CARE_PROVIDER_SITE_OTHER): Payer: Self-pay | Admitting: Physician Assistant

## 2016-04-15 ENCOUNTER — Encounter (INDEPENDENT_AMBULATORY_CARE_PROVIDER_SITE_OTHER): Payer: Self-pay

## 2016-04-15 ENCOUNTER — Encounter: Payer: Self-pay | Admitting: Physician Assistant

## 2016-04-15 VITALS — BP 168/100 | HR 88 | Temp 96.8°F | Ht 61.0 in | Wt 192.8 lb

## 2016-04-15 DIAGNOSIS — J40 Bronchitis, not specified as acute or chronic: Secondary | ICD-10-CM

## 2016-04-15 MED ORDER — AZITHROMYCIN 250 MG PO TABS: ORAL_TABLET | ORAL | 0 refills | Status: DC

## 2016-04-15 MED ORDER — LISINOPRIL 10 MG PO TABS: 10.0000 mg | ORAL_TABLET | Freq: Every day | ORAL | 5 refills | Status: DC

## 2016-04-15 MED ORDER — METHYLPREDNISOLONE ACETATE 80 MG/ML IJ SUSP
80.0000 mg | Freq: Once | INTRAMUSCULAR | Status: AC
  Administered 2016-04-15: 80 mg via INTRAMUSCULAR

## 2016-04-15 MED ORDER — HYDROCODONE-HOMATROPINE 5-1.5 MG/5ML PO SYRP: 5.0000 mL | ORAL_SOLUTION | Freq: Four times a day (QID) | ORAL | 0 refills | Status: DC | PRN

## 2016-04-15 NOTE — Progress Notes (Signed)
BP (!) 168/100   Pulse 88   Temp (!) 96.8 F (36 C) (Oral)   Ht 5\' 1"  (1.549 m)   Wt 192 lb 12.8 oz (87.5 kg)   BMI 36.43 kg/m    Subjective:    Patient ID: Danielle [name], female    DOB: 07/19/1965, 51 y.o.   MRN: [id number]  HPI: Danielle [name] is a 51 y.o. female presenting on 04/15/2016 for New Patient (Initial Visit); Cough (x 2 days); Nasal Congestion; and Headache  Patient with several days of progressing upper respiratory and bronchial symptoms. Initially there was more upper respiratory congestion. This progressed to having significant cough that is productive throughout the day and severe at night. There is occasional wheezing after coughing. They will sometimes have slight dyspnea on exertion. It is productive mucus that is yellow in color. Denies any blood.  Relevant past medical, surgical, family and social history reviewed and updated as indicated. Allergies and medications reviewed and updated.  Past Medical History:  Diagnosis Date  . Anxiety   . Arthritis   . Hypertension     Past Surgical History:  Procedure Laterality Date  . arm surgery      Review of Systems  Constitutional: Positive for chills and fatigue. Negative for activity change and appetite change.  HENT: Positive for congestion, postnasal drip and sore throat.   Eyes: Negative.   Respiratory: Positive for cough, shortness of breath and wheezing.   Cardiovascular: Negative.  Negative for chest pain, palpitations and leg swelling.  Gastrointestinal: Negative.   Genitourinary: Negative.   Musculoskeletal: Negative.   Skin: Negative.   Neurological: Positive for headaches.    Allergies as of 04/15/2016      Reactions   Penicillins Rash   Has patient had a PCN reaction causing immediate rash, facial/tongue/throat swelling, SOB or lightheadedness with hypotension: Yesyes Has patient had a PCN reaction causing severe rash involving mucus membranes or skin necrosis: No Has patient had a PCN  reaction that required hospitalization No Has patient had a PCN reaction occurring within the last 10 years: No If all of the above answers are "NO", then may proceed with Cephalosporin use.      Medication List       Accurate as of 04/15/16  6:26 PM. Always use your most recent med list.          azithromycin 250 MG tablet Commonly known as:  ZITHROMAX Z-PAK Take as directed   HYDROcodone-acetaminophen 7.5-325 MG tablet Commonly known as:  NORCO Take 1 tablet by mouth every 6 (six) hours as needed for moderate pain (Must last 30 days.Do not drive or operate machinery while taking this medicine.).   HYDROcodone-homatropine 5-1.5 MG/5ML syrup Commonly known as:  HYCODAN Take 5 mLs by mouth every 6 (six) hours as needed for cough.   lisinopril 10 MG tablet Commonly known as:  PRINIVIL,ZESTRIL Take 1-2 tablets (10-20 mg total) by mouth daily.          Objective:    BP (!) 168/100   Pulse 88   Temp (!) 96.8 F (36 C) (Oral)   Ht 5\' 1"  (1.549 m)   Wt 192 lb 12.8 oz (87.5 kg)   BMI 36.43 kg/m   Allergies  Allergen Reactions  . Penicillins Rash    Has patient had a PCN reaction causing immediate rash, facial/tongue/throat swelling, SOB or lightheadedness with hypotension: Yesyes Has patient had a PCN reaction causing severe rash involving mucus membranes or skin necrosis: No  Has patient had a PCN reaction that required hospitalization No Has patient had a PCN reaction occurring within the last 10 years: No If all of the above answers are "NO", then may proceed with Cephalosporin use.     Physical Exam  Constitutional: She is oriented to person, place, and time. She appears well-developed and well-nourished.  HENT:  Head: Normocephalic and atraumatic.  Right Ear: There is drainage and tenderness.  Left Ear: There is drainage and tenderness.  Nose: Mucosal edema and rhinorrhea present. Right sinus exhibits maxillary sinus tenderness and frontal sinus tenderness.  Left sinus exhibits maxillary sinus tenderness and frontal sinus tenderness.  Mouth/Throat: Oropharyngeal exudate and posterior oropharyngeal erythema present.  Eyes: Conjunctivae and EOM are normal. Pupils are equal, round, and reactive to light.  Neck: Normal range of motion. Neck supple.  Cardiovascular: Normal rate, regular rhythm, normal heart sounds and intact distal pulses.   Pulmonary/Chest: Effort normal. She has wheezes in the right upper field and the left upper field.  Abdominal: Soft. Bowel sounds are normal.  Neurological: She is alert and oriented to person, place, and time. She has normal reflexes.  Skin: Skin is warm and dry. No rash noted.  Psychiatric: She has a normal mood and affect. Her behavior is normal. Judgment and thought content normal.        Assessment & Plan:   1. Bronchitis - lisinopril (PRINIVIL,ZESTRIL) 10 MG tablet; Take 1-2 tablets (10-20 mg total) by mouth daily.  Dispense: 60 tablet; Refill: 5 - azithromycin (ZITHROMAX Z-PAK) 250 MG tablet; Take as directed  Dispense: 6 each; Refill: 0 - HYDROcodone-homatropine (HYCODAN) 5-1.5 MG/5ML syrup; Take 5 mLs by mouth every 6 (six) hours as needed for cough.  Dispense: 120 mL; Refill: 0 - methylPREDNISolone acetate (DEPO-MEDROL) injection 80 mg; Inject 1 mL (80 mg total) into the muscle once.   Continue all other maintenance medications as listed above.  Follow up plan: Return if symptoms worsen or fail to improve.  Educational handout given for bronchitis  Remus LofflerAngel S. Zayli Villafuerte PA-C [facility] [address]  [place], [place] [id number] [phone]   04/15/2016, 6:26 PM

## 2016-04-15 NOTE — Patient Instructions (Signed)

## 2016-04-20 ENCOUNTER — Telehealth: Payer: Self-pay | Admitting: Physician Assistant

## 2016-04-20 MED ORDER — DOXYCYCLINE HYCLATE 100 MG PO TABS: 100.0000 mg | ORAL_TABLET | Freq: Two times a day (BID) | ORAL | 0 refills | Status: DC

## 2016-04-20 NOTE — Telephone Encounter (Signed)
Call in doxycycline 100mg  1 tab po BID 10 days. #20

## 2016-04-20 NOTE — Telephone Encounter (Signed)
What symptoms do you have? Continuing wheezing, congestion. Like she was seen for last week. She has finished the zpack  How long have you been sick? Since last week  Have you been seen for this problem? yes  If your provider decides to give you a prescription, which pharmacy would you like for it to be sent to? [facility] in [place].    Patient informed that this information will be sent to the clinical staff for review and that they should receive a follow up call.

## 2016-04-20 NOTE — Telephone Encounter (Signed)
Patient seen Danielle [name] 3/21 and finished azithromycin. Patient states she is still having wheezing and nasal congestion and would like something called into [facility] in [place]. Please advise.

## 2016-04-20 NOTE — Telephone Encounter (Signed)
rx sent, patient aware 

## 2016-05-12 ENCOUNTER — Telehealth: Payer: Self-pay | Admitting: Orthopaedic Surgery

## 2016-05-12 NOTE — Telephone Encounter (Signed)
Hydrocodone-Acetaminophen  7.5/325 mg  Qty 70 Tablets  Pt has been informed to go thru MyChart next time. She stated she is working on it

## 2016-05-12 NOTE — Telephone Encounter (Signed)
Cannot be filled at this time as she had hydrocodene syrup given by another doctor since I wrote mine.

## 2016-05-20 ENCOUNTER — Other Ambulatory Visit: Payer: Self-pay | Admitting: Orthopaedic Surgery

## 2016-05-20 ENCOUNTER — Telehealth: Payer: Self-pay | Admitting: Orthopaedic Surgery

## 2016-05-20 MED ORDER — HYDROCODONE-ACETAMINOPHEN 7.5-325 MG PO TABS: 1.0000 | ORAL_TABLET | Freq: Four times a day (QID) | ORAL | 0 refills | Status: DC | PRN

## 2016-06-10 ENCOUNTER — Ambulatory Visit: Payer: Self-pay | Admitting: Orthopaedic Surgery

## 2016-06-18 ENCOUNTER — Ambulatory Visit (INDEPENDENT_AMBULATORY_CARE_PROVIDER_SITE_OTHER): Payer: Self-pay | Admitting: Orthopaedic Surgery

## 2016-06-18 ENCOUNTER — Encounter: Payer: Self-pay | Admitting: Orthopaedic Surgery

## 2016-06-18 VITALS — BP 162/101 | HR 116 | Temp 98.6°F | Ht 61.0 in | Wt 180.0 lb

## 2016-06-18 DIAGNOSIS — G8929 Other chronic pain: Secondary | ICD-10-CM

## 2016-06-18 DIAGNOSIS — M25562 Pain in left knee: Secondary | ICD-10-CM

## 2016-06-18 DIAGNOSIS — F17200 Nicotine dependence, unspecified, uncomplicated: Secondary | ICD-10-CM

## 2016-06-18 MED ORDER — HYDROCODONE-ACETAMINOPHEN 7.5-325 MG PO TABS: 1.0000 | ORAL_TABLET | Freq: Four times a day (QID) | ORAL | 0 refills | Status: DC | PRN

## 2016-06-18 NOTE — Patient Instructions (Signed)
Steps to Quit Smoking Smoking tobacco can be bad for your health. It can also affect almost every organ in your body. Smoking puts you and people around you at risk for many serious long-lasting (chronic) diseases. Quitting smoking is hard, but it is one of the best things that you can do for your health. It is never too late to quit. What are the benefits of quitting smoking? When you quit smoking, you lower your risk for getting serious diseases and conditions. They can include:  Lung cancer or lung disease.  Heart disease.  Stroke.  Heart attack.  Not being able to have children (infertility).  Weak bones (osteoporosis) and broken bones (fractures). If you have coughing, wheezing, and shortness of breath, those symptoms may get better when you quit. You may also get sick less often. If you are pregnant, quitting smoking can help to lower your chances of having a baby of low birth weight. What can I do to help me quit smoking? Talk with your doctor about what can help you quit smoking. Some things you can do (strategies) include:  Quitting smoking totally, instead of slowly cutting back how much you smoke over a period of time.  Going to in-person counseling. You are more likely to quit if you go to many counseling sessions.  Using resources and support systems, such as:  Online chats with a counselor.  Phone quitlines.  Printed self-help materials.  Support groups or group counseling.  Text messaging programs.  Mobile phone apps or applications.  Taking medicines. Some of these medicines may have nicotine in them. If you are pregnant or breastfeeding, do not take any medicines to quit smoking unless your doctor says it is okay. Talk with your doctor about counseling or other things that can help you. Talk with your doctor about using more than one strategy at the same time, such as taking medicines while you are also going to in-person counseling. This can help make quitting  easier. What things can I do to make it easier to quit? Quitting smoking might feel very hard at first, but there is a lot that you can do to make it easier. Take these steps:  Talk to your family and friends. Ask them to support and encourage you.  Call phone quitlines, reach out to support groups, or work with a counselor.  Ask people who smoke to not smoke around you.  Avoid places that make you want (trigger) to smoke, such as:  Bars.  Parties.  Smoke-break areas at work.  Spend time with people who do not smoke.  Lower the stress in your life. Stress can make you want to smoke. Try these things to help your stress:  Getting regular exercise.  Deep-breathing exercises.  Yoga.  Meditating.  Doing a body scan. To do this, close your eyes, focus on one area of your body at a time from head to toe, and notice which parts of your body are tense. Try to relax the muscles in those areas.  Download or buy apps on your mobile phone or tablet that can help you stick to your quit plan. There are many free apps, such as QuitGuide from the CDC (Centers for Disease Control and Prevention). You can find more support from smokefree.gov and other websites. This information is not intended to replace advice given to you by your health care provider. Make sure you discuss any questions you have with your health care provider. Document Released: 11/08/2008 Document Revised: 09/10/2015 Document   Reviewed: 05/29/2014 Elsevier Interactive Patient Education  2017 Elsevier Inc.  

## 2016-06-18 NOTE — Progress Notes (Signed)
PROCEDURE NOTE:  The patient request injection, verbal consent was obtained.  The left knee was prepped appropriately after time out was performed.   Sterile technique was observed and anesthesia was provided by ethyl chloride and a 20-gauge needle was used to inject the knee area.  A 16-gauge needle was then used to aspirate the knee.  Color of fluid aspirated was blood tinged  Total cc's aspirated was 35.    Injection of 1 cc of Depo-Medrol 40 mg with several cc's of plain xylocaine was then performed.  A band aid dressing was applied.  The patient was advised to apply ice later today and tomorrow to the injection sight as needed.  Encounter Diagnoses  Name Primary?  . Chronic pain of left knee Yes  . Tobacco smoker within last 12 months    I have reviewed the [place] Controlled Substance Reporting System web site prior to prescribing narcotic medicine for this patient.  I will see her in three months.  Call if any problem.   Electronically Signed Darreld McleanWayne Carma Dwiggins, MD 5/24/20189:26 AM

## 2016-06-24 ENCOUNTER — Emergency Department (HOSPITAL_COMMUNITY)
Admission: EM | Admit: 2016-06-24 | Discharge: 2016-06-25 | Disposition: A | Discharge status: Home / Self Care | Payer: Self-pay | Attending: Emergency Medicine | Admitting: Emergency Medicine

## 2016-06-24 ENCOUNTER — Encounter (HOSPITAL_COMMUNITY): Payer: Self-pay

## 2016-06-24 DIAGNOSIS — I10 Essential (primary) hypertension: Secondary | ICD-10-CM | POA: Insufficient documentation

## 2016-06-24 DIAGNOSIS — F17210 Nicotine dependence, cigarettes, uncomplicated: Secondary | ICD-10-CM | POA: Insufficient documentation

## 2016-06-24 DIAGNOSIS — Z79899 Other long term (current) drug therapy: Secondary | ICD-10-CM | POA: Insufficient documentation

## 2016-06-24 DIAGNOSIS — N3000 Acute cystitis without hematuria: Secondary | ICD-10-CM

## 2016-06-24 DIAGNOSIS — N8189 Other female genital prolapse: Secondary | ICD-10-CM

## 2016-06-24 NOTE — ED Triage Notes (Addendum)
Pt states when she got up from the commode she felt something that was like tissue protruding from her vagina, but denies bleeding or other discharge.

## 2016-06-25 LAB — URINALYSIS, ROUTINE W REFLEX MICROSCOPIC
BILIRUBIN URINE: NEGATIVE
GLUCOSE, UA: NEGATIVE mg/dL
Ketones, ur: NEGATIVE mg/dL
NITRITE: NEGATIVE
PH: 6 (ref 5.0–8.0)
Protein, ur: NEGATIVE mg/dL
SPECIFIC GRAVITY, URINE: 1.004 — AB (ref 1.005–1.030)

## 2016-06-25 MED ORDER — CIPROFLOXACIN HCL 500 MG PO TABS: 500.0000 mg | ORAL_TABLET | Freq: Two times a day (BID) | ORAL | 0 refills | Status: DC

## 2016-06-25 NOTE — ED Provider Notes (Signed)
[facility]-EM[facility]Y DEPT Provider Note   CSN: [id number] Arrival date & time: 06/24/16  2319   By signing my name below, I, Danielle [name], attest that this documentation has been prepared under the direction and in the presence of Zadie RhineWickline, Cyanne Delmar, MD. Electronically signed, Danielle [name], ED Scribe. 06/25/16. 12:34 AM.   History   Chief Complaint Chief Complaint  Patient presents with  . bladder pressure   The history is provided by the patient and medical records. No language interpreter was used.    Danielle [name] is a 51 y.o. female who presents to the Emergency Department with concern for pelvic discomfort onset ~9:30 PM last nigt. Pt states she felt like something "dropped down" from her pelvic area while attempting to stand up from using the toilet. No h/o similar symptoms noted. She currently c/o pain after urination. Pt notes tobacco use. H/o D&C procedure previously. No bladder surgeries noted; no h/o hysterectomy. No fever, vomit, back pain, abdominal pain or dysuria while urinating noted. No other complaints at this time.  Past Medical History:  Diagnosis Date  . Anxiety   . Arthritis   . Hypertension     Patient Active Problem List   Diagnosis Date Noted  . CAP (community acquired pneumonia) 08/07/2015  . Acute respiratory failure with hypoxia (HCC) 08/07/2015  . SIRS (systemic inflammatory response syndrome) (HCC) 08/07/2015  . Leukocytosis 08/07/2015  . Hypokalemia 08/07/2015    Past Surgical History:  Procedure Laterality Date  . arm surgery      OB History    Gravida Para Term Preterm AB Living   2         2   SAB TAB Ectopic Multiple Live Births                   Home Medications    Prior to Admission medications   Medication Sig Start Date End Date Taking? Authorizing Provider  HYDROcodone-acetaminophen (NORCO) 7.5-325 MG tablet Take 1 tablet by mouth every 6 (six) hours as needed for moderate pain (Must last 30 days.Do not drive or operate  machinery while taking this medicine.). 06/18/16  Yes Darreld McleanKeeling, Wayne, MD  lisinopril (PRINIVIL,ZESTRIL) 10 MG tablet Take 1-2 tablets (10-20 mg total) by mouth daily. 04/15/16  Yes Remus LofflerJones, Angel S, PA-C  azithromycin (ZITHROMAX Z-PAK) 250 MG tablet Take as directed 04/15/16   Remus LofflerJones, Angel S, PA-C  doxycycline (VIBRA-TABS) 100 MG tablet Take 1 tablet (100 mg total) by mouth 2 (two) times daily. 1 po bid 04/20/16   Remus LofflerJones, Angel S, PA-C    Family History Family History  Problem Relation Age of Onset  . Hypertension Mother     Social History Social History  Substance Use Topics  . Smoking status: Current Every Day Smoker    Packs/day: 1.00    Types: Cigarettes  . Smokeless tobacco: Never Used  . Alcohol use Yes     Comment: occassionally     Allergies   Penicillins   Review of Systems Review of Systems  Constitutional: Negative for fever.  Gastrointestinal: Negative for abdominal pain, nausea and vomiting.  Genitourinary: Positive for pelvic pain. Negative for difficulty urinating, dysuria, flank pain, frequency and vaginal pain.  Musculoskeletal: Negative for back pain.  All other systems reviewed and are negative.    Physical Exam Updated Vital Signs BP (!) 191/103 (BP Location: Right Arm)   Pulse (!) 106   Temp 98.3 F (36.8 C) (Oral)   Resp 15   Ht 5\' 1"  (1.549  m)   Wt 180 lb (81.6 kg)   LMP 06/06/2016 (Approximate)   SpO2 100%   BMI 34.01 kg/m   Physical Exam CONSTITUTIONAL: Well developed/well nourished HEAD: Normocephalic/atraumatic ENMT: Mucous membranes moist NECK: supple no meningeal signs CV: S1/S2 noted, no murmurs/rubs/gallops noted LUNGS: scattered wheeze, no distress noted ABDOMEN: soft, nontender GU:no cva tenderness , ?mild uterine prolapse noted.  No vaginal bleeding/discharge noted.  Nurse chaperone Wilkie Aye present for exam NEURO: Pt is awake/alert/appropriate, moves all extremitiesx4.  No facial droop.   EXTREMITIES: pulses normal/equal, full  ROM SKIN: warm, color normal PSYCH: no abnormalities of mood noted, alert and oriented to situation   ED Treatments / Results  DIAGNOSTIC STUDIES: Oxygen Saturation is 100% on RA, NL by my interpretation.    COORDINATION OF CARE: 12:28 AM-Discussed next steps with pt. Pt verbalized understanding and is agreeable with the plan. Pt prepared for female exam.   Labs (all labs ordered are listed, but only abnormal results are displayed) Labs Reviewed  URINALYSIS, ROUTINE W REFLEX MICROSCOPIC - Abnormal; Notable for the following:       Result Value   Color, Urine STRAW (*)    Specific Gravity, Urine 1.004 (*)    Hgb urine dipstick MODERATE (*)    Leukocytes, UA MODERATE (*)    Bacteria, UA RARE (*)    Squamous Epithelial / LPF 6-30 (*)    All other components within normal limits    EKG  EKG Interpretation None       Radiology No results found.  Procedures Procedures (including critical care time)  Medications Ordered in ED Medications - No data to display   Initial Impression / Assessment and Plan / ED Course  I have reviewed the triage vital signs and the nursing notes.  Pertinent labs   results that were available during my care of the patient were reviewed by me and considered in my medical decision making (see chart for details).     Suspect mild uterine prolapse Pt otherwise stable She is nontoxic, well appearing Also has UTI Treat UTI Refer to her PCP Advised to quit smoking   Final Clinical Impressions(s) / ED Diagnoses   Final diagnoses:  Other female genital prolapse  Acute cystitis without hematuria    New Prescriptions Discharge Medication List as of 06/25/2016 12:47 AM    START taking these medications   Details  ciprofloxacin (CIPRO) 500 MG tablet Take 1 tablet (500 mg total) by mouth 2 (two) times daily. One po bid x 7 days, Starting Thu 06/25/2016, Print      I personally performed the services described in this documentation, which  was scribed in my presence. The recorded information has been reviewed and is accurate.        Zadie Rhine, MD 06/25/16 [phone]

## 2016-07-07 ENCOUNTER — Telehealth: Payer: Self-pay | Admitting: Physician Assistant

## 2016-07-07 NOTE — Telephone Encounter (Signed)
Call to pt Pt has appt on Thurs Will discuss at appt

## 2016-07-07 NOTE — Telephone Encounter (Signed)
What symptoms do you have? Runny Nosy, sinus   How long have you been sick? Couple weeks taking otc no better  Have you been seen for this problem? Pt was in to be seen for bronchitis in March  If your provider decides to give you a prescription, which pharmacy would you like for it to be sent to? [facility]   Patient informed that this information will be sent to the clinical staff for review and that they should receive a follow up call.

## 2016-07-09 ENCOUNTER — Ambulatory Visit: Payer: Self-pay | Admitting: Physician Assistant

## 2016-07-15 ENCOUNTER — Ambulatory Visit: Payer: Self-pay | Admitting: Physician Assistant

## 2016-07-22 ENCOUNTER — Telehealth: Payer: Self-pay | Admitting: Orthopaedic Surgery

## 2016-07-22 ENCOUNTER — Other Ambulatory Visit: Payer: Self-pay | Admitting: Orthopaedic Surgery

## 2016-07-22 MED ORDER — HYDROCODONE-ACETAMINOPHEN 7.5-325 MG PO TABS: 1.0000 | ORAL_TABLET | Freq: Four times a day (QID) | ORAL | 0 refills | Status: DC | PRN

## 2016-08-20 ENCOUNTER — Telehealth: Payer: Self-pay | Admitting: Orthopaedic Surgery

## 2016-08-20 MED ORDER — HYDROCODONE-ACETAMINOPHEN 7.5-325 MG PO TABS: 1.0000 | ORAL_TABLET | Freq: Four times a day (QID) | ORAL | 0 refills | Status: DC | PRN

## 2016-09-17 ENCOUNTER — Ambulatory Visit: Payer: Self-pay | Admitting: Orthopaedic Surgery

## 2016-10-01 ENCOUNTER — Ambulatory Visit: Payer: Self-pay | Admitting: Orthopaedic Surgery

## 2016-10-08 ENCOUNTER — Encounter: Payer: Self-pay | Admitting: Orthopaedic Surgery

## 2016-10-08 ENCOUNTER — Other Ambulatory Visit: Payer: Self-pay | Admitting: *Deleted

## 2016-10-08 ENCOUNTER — Ambulatory Visit (INDEPENDENT_AMBULATORY_CARE_PROVIDER_SITE_OTHER): Payer: Self-pay | Admitting: Orthopaedic Surgery

## 2016-10-08 VITALS — BP 202/103 | HR 115 | Temp 97.3°F | Ht 61.0 in | Wt 181.0 lb

## 2016-10-08 DIAGNOSIS — M25562 Pain in left knee: Secondary | ICD-10-CM

## 2016-10-08 DIAGNOSIS — G8929 Other chronic pain: Secondary | ICD-10-CM

## 2016-10-08 MED ORDER — HYDROCODONE-ACETAMINOPHEN 7.5-325 MG PO TABS: 1.0000 | ORAL_TABLET | Freq: Four times a day (QID) | ORAL | 0 refills | Status: DC | PRN

## 2016-10-08 NOTE — Progress Notes (Signed)
Patient Danielle [name]:Danielle [name], female DOB:09/18/1965, 51 y.o. VWU:[id number]  Chief Complaint  Patient presents with  . Follow-up    Chronic pain left knee    HPI  Danielle [name] is a 51 y.o. female who has chronic knee pain on the left.  She has recurring effusions and popping.  She has no new trauma.  She has no redness.  She has no giving way.  She is taking her medicine.  The injection several months ago has helped. HPI  Body mass index is 34.2 kg/m.  ROS  Review of Systems  HENT: Negative for congestion.   Respiratory: Negative for cough and shortness of breath.   Cardiovascular: Negative for chest pain and leg swelling.  Endocrine: Positive for cold intolerance.  Musculoskeletal: Positive for arthralgias, gait problem, joint swelling and myalgias.  Allergic/Immunologic: Negative for environmental allergies.    Past Medical History:  Diagnosis Date  . Anxiety   . Arthritis   . Hypertension     Past Surgical History:  Procedure Laterality Date  . arm surgery      Family History  Problem Relation Age of Onset  . Hypertension Mother     Social History Social History  Substance Use Topics  . Smoking status: Current Every Day Smoker    Packs/day: 1.00    Types: Cigarettes  . Smokeless tobacco: Never Used  . Alcohol use Yes     Comment: occassionally    Allergies  Allergen Reactions  . Penicillins Rash    Has patient had a PCN reaction causing immediate rash, facial/tongue/throat swelling, SOB or lightheadedness with hypotension: Yesyes Has patient had a PCN reaction causing severe rash involving mucus membranes or skin necrosis: No Has patient had a PCN reaction that required hospitalization No Has patient had a PCN reaction occurring within the last 10 years: No If all of the above answers are "NO", then may proceed with Cephalosporin use.     Current Outpatient Prescriptions  Medication Sig Dispense Refill  . ciprofloxacin (CIPRO) 500 MG tablet Take 1  tablet (500 mg total) by mouth 2 (two) times daily. One po bid x 7 days 14 tablet 0  . HYDROcodone-acetaminophen (NORCO) 7.5-325 MG tablet Take 1 tablet by mouth every 6 (six) hours as needed for moderate pain (Must last 30 days.Do not drive or operate machinery while taking this medicine.). 45 tablet 0  . lisinopril (PRINIVIL,ZESTRIL) 10 MG tablet Take 1-2 tablets (10-20 mg total) by mouth daily. 60 tablet 5   No current facility-administered medications for this visit.      Physical Exam  Blood pressure (!) 202/103, pulse (!) 115, temperature (!) 97.3 F (36.3 C), height 5\' 1"  (1.549 m), weight 181 lb (82.1 kg).  Constitutional: overall normal hygiene, normal nutrition, well developed, normal grooming, normal body habitus. Assistive device:none  Musculoskeletal: gait and station Limp left, muscle tone and strength are normal, no tremors or atrophy is present.  .  Neurological: coordination overall normal.  Deep tendon reflex/nerve stretch intact.  Sensation normal.  Cranial nerves II-XII intact.   Skin:   Normal overall no scars, lesions, ulcers or rashes. No psoriasis.  Psychiatric: Alert and oriented x 3.  Recent memory intact, remote memory unclear.  Normal mood and affect. Well groomed.  Good eye contact.  Cardiovascular: overall no swelling, no varicosities, no edema bilaterally, normal temperatures of the legs and arms, no clubbing, cyanosis and good capillary refill.  Lymphatic: palpation is normal.  The left lower extremity is examined:  Inspection:  Thigh:  Non-tender and no defects  Knee has swelling 1+ effusion.                        Joint tenderness is present                        Patient is tender over the medial joint line  Lower Leg:  Has normal appearance and no tenderness or defects  Ankle:  Non-tender and no defects  Foot:  Non-tender and no defects Range of Motion:  Knee:  Range of motion is: 0-105                        Crepitus is  present  Ankle:   Range of motion is normal. Strength and Tone:  The left lower extremity has normal strength and tone. Stability:  Knee:  The knee is stable.  Ankle:  The ankle is stable.   All other systems reviewed and are negative   The patient has been educated about the nature of the problem(s) and counseled on treatment options.  The patient appeared to understand what I have discussed and is in agreement with it.  Encounter Diagnosis  Name Primary?  . Chronic pain of left knee Yes    PLAN Call if any problems.  Precautions discussed.  Continue current medications.   Return to clinic 3 months   I have reviewed the [facility] Controlled Substance Reporting System web site prior to prescribing narcotic medicine for this patient.  Electronically Signed Darreld Mclean, MD 9/13/201810:17 AM

## 2016-10-08 NOTE — Patient Instructions (Signed)
Steps to Quit Smoking Smoking tobacco can be bad for your health. It can also affect almost every organ in your body. Smoking puts you and people around you at risk for many serious Creedence Kunesh-lasting (chronic) diseases. Quitting smoking is hard, but it is one of the best things that you can do for your health. It is never too late to quit. What are the benefits of quitting smoking? When you quit smoking, you lower your risk for getting serious diseases and conditions. They can include:  Lung cancer or lung disease.  Heart disease.  Stroke.  Heart attack.  Not being able to have children (infertility).  Weak bones (osteoporosis) and broken bones (fractures).  If you have coughing, wheezing, and shortness of breath, those symptoms may get better when you quit. You may also get sick less often. If you are pregnant, quitting smoking can help to lower your chances of having a baby of low birth weight. What can I do to help me quit smoking? Talk with your doctor about what can help you quit smoking. Some things you can do (strategies) include:  Quitting smoking totally, instead of slowly cutting back how much you smoke over a period of time.  Going to in-person counseling. You are more likely to quit if you go to many counseling sessions.  Using resources and support systems, such as: ? Online chats with a counselor. ? Phone quitlines. ? Printed self-help materials. ? Support groups or group counseling. ? Text messaging programs. ? Mobile phone apps or applications.  Taking medicines. Some of these medicines may have nicotine in them. If you are pregnant or breastfeeding, do not take any medicines to quit smoking unless your doctor says it is okay. Talk with your doctor about counseling or other things that can help you.  Talk with your doctor about using more than one strategy at the same time, such as taking medicines while you are also going to in-person counseling. This can help make  quitting easier. What things can I do to make it easier to quit? Quitting smoking might feel very hard at first, but there is a lot that you can do to make it easier. Take these steps:  Talk to your family and friends. Ask them to support and encourage you.  Call phone quitlines, reach out to support groups, or work with a counselor.  Ask people who smoke to not smoke around you.  Avoid places that make you want (trigger) to smoke, such as: ? Bars. ? Parties. ? Smoke-break areas at work.  Spend time with people who do not smoke.  Lower the stress in your life. Stress can make you want to smoke. Try these things to help your stress: ? Getting regular exercise. ? Deep-breathing exercises. ? Yoga. ? Meditating. ? Doing a body scan. To do this, close your eyes, focus on one area of your body at a time from head to toe, and notice which parts of your body are tense. Try to relax the muscles in those areas.  Download or buy apps on your mobile phone or tablet that can help you stick to your quit plan. There are many free apps, such as QuitGuide from the CDC (Centers for Disease Control and Prevention). You can find more support from smokefree.gov and other websites.  This information is not intended to replace advice given to you by your health care provider. Make sure you discuss any questions you have with your health care provider. Document Released: 11/08/2008 Document   Revised: 09/10/2015 Document Reviewed: 05/29/2014 Elsevier Interactive Patient Education  2018 Elsevier Inc.  

## 2016-11-05 ENCOUNTER — Telehealth: Payer: Self-pay | Admitting: Orthopaedic Surgery

## 2016-11-10 MED ORDER — HYDROCODONE-ACETAMINOPHEN 7.5-325 MG PO TABS: 1.0000 | ORAL_TABLET | Freq: Four times a day (QID) | ORAL | 0 refills | Status: DC | PRN

## 2016-12-09 ENCOUNTER — Telehealth: Payer: Self-pay | Admitting: Orthopaedic Surgery

## 2016-12-09 MED ORDER — HYDROCODONE-ACETAMINOPHEN 7.5-325 MG PO TABS: 1.0000 | ORAL_TABLET | Freq: Four times a day (QID) | ORAL | 0 refills | Status: DC | PRN

## 2016-12-09 NOTE — Telephone Encounter (Signed)
Routing to Dr. Keeling 

## 2017-01-14 ENCOUNTER — Encounter: Payer: Self-pay | Admitting: Orthopaedic Surgery

## 2017-01-14 ENCOUNTER — Ambulatory Visit: Payer: Self-pay | Admitting: Orthopaedic Surgery

## 2017-01-14 DIAGNOSIS — M25562 Pain in left knee: Secondary | ICD-10-CM

## 2017-01-14 DIAGNOSIS — F17200 Nicotine dependence, unspecified, uncomplicated: Secondary | ICD-10-CM

## 2017-01-14 DIAGNOSIS — G8929 Other chronic pain: Secondary | ICD-10-CM

## 2017-01-14 MED ORDER — HYDROCODONE-ACETAMINOPHEN 7.5-325 MG PO TABS: 1.0000 | ORAL_TABLET | Freq: Four times a day (QID) | ORAL | 0 refills | Status: DC | PRN

## 2017-01-14 NOTE — Progress Notes (Signed)
CC:  I have pain of my left knee. I would like an injection.  The patient has chronic pain of the left knee.  There is no recent trauma.  There is no redness.  Injections in the past have helped.  The knee has no redness, has an effusion and crepitus present.  ROM of the left knee is 0-110.  Impression:  Chronic knee pain left  Return: 2 months  PROCEDURE NOTE:  The patient requests injections of the left knee, verbal consent was obtained.  The left knee was prepped appropriately after time out was performed.   Sterile technique was observed and injection of 1 cc of Depo-Medrol 40 mg with several cc's of plain xylocaine. Anesthesia was provided by ethyl chloride and a 20-gauge needle was used to inject the knee area. The injection was tolerated well.  A band aid dressing was applied.  The patient was advised to apply ice later today and tomorrow to the injection sight as needed.  I have reviewed the [place] Controlled Substance Reporting System web site prior to prescribing narcotic medicine for this patient.  Electronically Signed Darreld McleanWayne Nastasha Reising, MD 12/20/20189:40 AM

## 2017-02-10 ENCOUNTER — Other Ambulatory Visit: Payer: Self-pay | Admitting: Orthopaedic Surgery

## 2017-02-10 MED ORDER — HYDROCODONE-ACETAMINOPHEN 7.5-325 MG PO TABS: 1.0000 | ORAL_TABLET | Freq: Four times a day (QID) | ORAL | 0 refills | Status: DC | PRN

## 2017-03-18 ENCOUNTER — Ambulatory Visit: Payer: Self-pay | Admitting: Orthopaedic Surgery

## 2017-03-25 ENCOUNTER — Encounter: Payer: Self-pay | Admitting: Orthopaedic Surgery

## 2017-03-25 ENCOUNTER — Ambulatory Visit: Payer: Self-pay | Admitting: Orthopaedic Surgery

## 2017-03-25 DIAGNOSIS — M25562 Pain in left knee: Secondary | ICD-10-CM

## 2017-03-25 DIAGNOSIS — G8929 Other chronic pain: Secondary | ICD-10-CM

## 2017-03-25 DIAGNOSIS — F17200 Nicotine dependence, unspecified, uncomplicated: Secondary | ICD-10-CM

## 2017-03-25 MED ORDER — HYDROCODONE-ACETAMINOPHEN 7.5-325 MG PO TABS: 1.0000 | ORAL_TABLET | Freq: Four times a day (QID) | ORAL | 0 refills | Status: DC | PRN

## 2017-03-25 NOTE — Patient Instructions (Signed)
Steps to Quit Smoking Smoking tobacco can be bad for your health. It can also affect almost every organ in your body. Smoking puts you and people around you at risk for many serious long-lasting (chronic) diseases. Quitting smoking is hard, but it is one of the best things that you can do for your health. It is never too late to quit. What are the benefits of quitting smoking? When you quit smoking, you lower your risk for getting serious diseases and conditions. They can include:  Lung cancer or lung disease.  Heart disease.  Stroke.  Heart attack.  Not being able to have children (infertility).  Weak bones (osteoporosis) and broken bones (fractures).  If you have coughing, wheezing, and shortness of breath, those symptoms may get better when you quit. You may also get sick less often. If you are pregnant, quitting smoking can help to lower your chances of having a baby of low birth weight. What can I do to help me quit smoking? Talk with your doctor about what can help you quit smoking. Some things you can do (strategies) include:  Quitting smoking totally, instead of slowly cutting back how much you smoke over a period of time.  Going to in-person counseling. You are more likely to quit if you go to many counseling sessions.  Using resources and support systems, such as: ? Online chats with a counselor. ? Phone quitlines. ? Printed self-help materials. ? Support groups or group counseling. ? Text messaging programs. ? Mobile phone apps or applications.  Taking medicines. Some of these medicines may have nicotine in them. If you are pregnant or breastfeeding, do not take any medicines to quit smoking unless your doctor says it is okay. Talk with your doctor about counseling or other things that can help you.  Talk with your doctor about using more than one strategy at the same time, such as taking medicines while you are also going to in-person counseling. This can help make  quitting easier. What things can I do to make it easier to quit? Quitting smoking might feel very hard at first, but there is a lot that you can do to make it easier. Take these steps:  Talk to your family and friends. Ask them to support and encourage you.  Call phone quitlines, reach out to support groups, or work with a counselor.  Ask people who smoke to not smoke around you.  Avoid places that make you want (trigger) to smoke, such as: ? Bars. ? Parties. ? Smoke-break areas at work.  Spend time with people who do not smoke.  Lower the stress in your life. Stress can make you want to smoke. Try these things to help your stress: ? Getting regular exercise. ? Deep-breathing exercises. ? Yoga. ? Meditating. ? Doing a body scan. To do this, close your eyes, focus on one area of your body at a time from head to toe, and notice which parts of your body are tense. Try to relax the muscles in those areas.  Download or buy apps on your mobile phone or tablet that can help you stick to your quit plan. There are many free apps, such as QuitGuide from the CDC (Centers for Disease Control and Prevention). You can find more support from smokefree.gov and other websites.  This information is not intended to replace advice given to you by your health care provider. Make sure you discuss any questions you have with your health care provider. Document Released: 11/08/2008 Document   Revised: 09/10/2015 Document Reviewed: 05/29/2014 Elsevier Interactive Patient Education  2018 Elsevier Inc.  

## 2017-03-25 NOTE — Progress Notes (Signed)
CC:  I have pain of my left knee. I would like an injection.  The patient has chronic pain of the left knee.  There is no recent trauma.  There is no redness.  Injections in the past have helped.  The knee has no redness, has an effusion and crepitus present.  ROM of the left knee is 0-110.  Impression:  Chronic knee pain left  Return: 3 months  PROCEDURE NOTE:  The patient requests injections of the left knee, verbal consent was obtained.  The left knee was prepped appropriately after time out was performed.   Sterile technique was observed and injection of 1 cc of Depo-Medrol 40 mg with several cc's of plain xylocaine. Anesthesia was provided by ethyl chloride and a 20-gauge needle was used to inject the knee area. The injection was tolerated well.  A band aid dressing was applied.  The patient was advised to apply ice later today and tomorrow to the injection sight as needed.  I have reviewed the [place] Controlled Substance Reporting System web site prior to prescribing narcotic medicine for this patient.  Electronically Signed Darreld McleanWayne Dovie Kapusta, MD 2/28/201910:06 AM

## 2017-04-27 ENCOUNTER — Other Ambulatory Visit: Payer: Self-pay | Admitting: Orthopaedic Surgery

## 2017-04-27 MED ORDER — HYDROCODONE-ACETAMINOPHEN 7.5-325 MG PO TABS: 1.0000 | ORAL_TABLET | Freq: Four times a day (QID) | ORAL | 0 refills | Status: DC | PRN

## 2017-05-26 ENCOUNTER — Other Ambulatory Visit: Payer: Self-pay | Admitting: Orthopedic Surgery

## 2017-05-26 MED ORDER — HYDROCODONE-ACETAMINOPHEN 7.5-325 MG PO TABS: 1.0000 | ORAL_TABLET | Freq: Four times a day (QID) | ORAL | 0 refills | Status: DC | PRN

## 2017-06-22 ENCOUNTER — Ambulatory Visit: Payer: Self-pay | Admitting: Orthopaedic Surgery

## 2017-07-01 ENCOUNTER — Ambulatory Visit: Payer: Self-pay | Admitting: Orthopaedic Surgery

## 2017-07-01 ENCOUNTER — Encounter: Payer: Self-pay | Admitting: Orthopaedic Surgery

## 2017-07-01 DIAGNOSIS — F17200 Nicotine dependence, unspecified, uncomplicated: Secondary | ICD-10-CM

## 2017-07-01 DIAGNOSIS — M25562 Pain in left knee: Secondary | ICD-10-CM

## 2017-07-01 DIAGNOSIS — G8929 Other chronic pain: Secondary | ICD-10-CM

## 2017-07-01 MED ORDER — HYDROCODONE-ACETAMINOPHEN 7.5-325 MG PO TABS: 1.0000 | ORAL_TABLET | Freq: Four times a day (QID) | ORAL | 0 refills | Status: DC | PRN

## 2017-07-01 NOTE — Progress Notes (Signed)
CC:  I have pain of my left knee. I would like an injection.  The patient has chronic pain of the left knee.  There is no recent trauma.  There is no redness.  Injections in the past have helped.  The knee has no redness, has an effusion and crepitus present.  ROM of the left knee is 0-105.  Impression:  Chronic knee pain left  Return: 3 months  PROCEDURE NOTE:  The patient requests injections of the left knee, verbal consent was obtained.  The left knee was prepped appropriately after time out was performed.   Sterile technique was observed and injection of 1 cc of Depo-Medrol 40 mg with several cc's of plain xylocaine. Anesthesia was provided by ethyl chloride and a 20-gauge needle was used to inject the knee area. The injection was tolerated well.  A band aid dressing was applied.  The patient was advised to apply ice later today and tomorrow to the injection sight as needed.  I have reviewed the [place] Controlled Substance Reporting System web site prior to prescribing narcotic medicine for this patient.  Electronically Signed Darreld McleanWayne Ryo Klang, MD 6/6/20199:37 AM

## 2017-07-01 NOTE — Patient Instructions (Signed)
Steps to Quit Smoking Smoking tobacco can be bad for your health. It can also affect almost every organ in your body. Smoking puts you and people around you at risk for many serious long-lasting (chronic) diseases. Quitting smoking is hard, but it is one of the best things that you can do for your health. It is never too late to quit. What are the benefits of quitting smoking? When you quit smoking, you lower your risk for getting serious diseases and conditions. They can include:  Lung cancer or lung disease.  Heart disease.  Stroke.  Heart attack.  Not being able to have children (infertility).  Weak bones (osteoporosis) and broken bones (fractures).  If you have coughing, wheezing, and shortness of breath, those symptoms may get better when you quit. You may also get sick less often. If you are pregnant, quitting smoking can help to lower your chances of having a baby of low birth weight. What can I do to help me quit smoking? Talk with your doctor about what can help you quit smoking. Some things you can do (strategies) include:  Quitting smoking totally, instead of slowly cutting back how much you smoke over a period of time.  Going to in-person counseling. You are more likely to quit if you go to many counseling sessions.  Using resources and support systems, such as: ? Online chats with a counselor. ? Phone quitlines. ? Printed self-help materials. ? Support groups or group counseling. ? Text messaging programs. ? Mobile phone apps or applications.  Taking medicines. Some of these medicines may have nicotine in them. If you are pregnant or breastfeeding, do not take any medicines to quit smoking unless your doctor says it is okay. Talk with your doctor about counseling or other things that can help you.  Talk with your doctor about using more than one strategy at the same time, such as taking medicines while you are also going to in-person counseling. This can help make  quitting easier. What things can I do to make it easier to quit? Quitting smoking might feel very hard at first, but there is a lot that you can do to make it easier. Take these steps:  Talk to your family and friends. Ask them to support and encourage you.  Call phone quitlines, reach out to support groups, or work with a counselor.  Ask people who smoke to not smoke around you.  Avoid places that make you want (trigger) to smoke, such as: ? Bars. ? Parties. ? Smoke-break areas at work.  Spend time with people who do not smoke.  Lower the stress in your life. Stress can make you want to smoke. Try these things to help your stress: ? Getting regular exercise. ? Deep-breathing exercises. ? Yoga. ? Meditating. ? Doing a body scan. To do this, close your eyes, focus on one area of your body at a time from head to toe, and notice which parts of your body are tense. Try to relax the muscles in those areas.  Download or buy apps on your mobile phone or tablet that can help you stick to your quit plan. There are many free apps, such as QuitGuide from the CDC (Centers for Disease Control and Prevention). You can find more support from smokefree.gov and other websites.  This information is not intended to replace advice given to you by your health care provider. Make sure you discuss any questions you have with your health care provider. Document Released: 11/08/2008 Document   Revised: 09/10/2015 Document Reviewed: 05/29/2014 Elsevier Interactive Patient Education  2018 Elsevier Inc.  

## 2017-07-27 ENCOUNTER — Other Ambulatory Visit: Payer: Self-pay | Admitting: Orthopaedic Surgery

## 2017-07-28 MED ORDER — HYDROCODONE-ACETAMINOPHEN 7.5-325 MG PO TABS: 1.0000 | ORAL_TABLET | Freq: Four times a day (QID) | ORAL | 0 refills | Status: DC | PRN

## 2017-08-31 ENCOUNTER — Other Ambulatory Visit: Payer: Self-pay | Admitting: Orthopaedic Surgery

## 2017-08-31 MED ORDER — HYDROCODONE-ACETAMINOPHEN 7.5-325 MG PO TABS: 1.0000 | ORAL_TABLET | Freq: Four times a day (QID) | ORAL | 0 refills | Status: DC | PRN

## 2017-09-30 ENCOUNTER — Ambulatory Visit: Payer: Self-pay | Admitting: Orthopaedic Surgery

## 2017-10-20 ENCOUNTER — Ambulatory Visit: Payer: Self-pay | Admitting: Orthopaedic Surgery

## 2017-10-26 ENCOUNTER — Encounter: Payer: Self-pay | Admitting: Orthopaedic Surgery

## 2017-10-26 ENCOUNTER — Ambulatory Visit: Payer: Self-pay | Admitting: Orthopaedic Surgery

## 2017-10-26 VITALS — Ht 61.0 in | Wt 186.0 lb

## 2017-10-26 DIAGNOSIS — F17200 Nicotine dependence, unspecified, uncomplicated: Secondary | ICD-10-CM

## 2017-10-26 DIAGNOSIS — M25562 Pain in left knee: Secondary | ICD-10-CM

## 2017-10-26 DIAGNOSIS — G8929 Other chronic pain: Secondary | ICD-10-CM

## 2017-10-26 MED ORDER — HYDROCODONE-ACETAMINOPHEN 7.5-325 MG PO TABS: 1.0000 | ORAL_TABLET | Freq: Four times a day (QID) | ORAL | 0 refills | Status: DC | PRN

## 2017-10-26 NOTE — Progress Notes (Signed)
CC:  I have pain of my left knee. I would like an injection.  The patient has chronic pain of the left knee.  There is no recent trauma.  There is no redness.  Injections in the past have helped.  The knee has no redness, has an effusion and crepitus present.  ROM of the left knee is 0-110.  Impression:  Chronic knee pain left  Return: 3 months  PROCEDURE NOTE:  The patient requests injections of the left knee, verbal consent was obtained.  The left knee was prepped appropriately after time out was performed.   Sterile technique was observed and injection of 1 cc of Depo-Medrol 40 mg with several cc's of plain xylocaine. Anesthesia was provided by ethyl chloride and a 20-gauge needle was used to inject the knee area. The injection was tolerated well.  A band aid dressing was applied.  The patient was advised to apply ice later today and tomorrow to the injection sight as needed.  I have reviewed the [place] Controlled Substance Reporting System web site prior to prescribing narcotic medicine for this patient.    Electronically Signed Darreld Mclean, MD 10/1/20199:02 AM

## 2017-11-24 ENCOUNTER — Other Ambulatory Visit: Payer: Self-pay

## 2017-11-24 MED ORDER — HYDROCODONE-ACETAMINOPHEN 7.5-325 MG PO TABS: 1.0000 | ORAL_TABLET | Freq: Four times a day (QID) | ORAL | 0 refills | Status: DC | PRN

## 2017-12-27 ENCOUNTER — Other Ambulatory Visit: Payer: Self-pay

## 2017-12-28 MED ORDER — HYDROCODONE-ACETAMINOPHEN 7.5-325 MG PO TABS: 1.0000 | ORAL_TABLET | Freq: Four times a day (QID) | ORAL | 0 refills | Status: DC | PRN

## 2018-01-17 ENCOUNTER — Encounter: Payer: Self-pay | Admitting: Family Medicine

## 2018-01-20 ENCOUNTER — Encounter: Payer: Self-pay | Admitting: Family Medicine

## 2018-01-27 ENCOUNTER — Ambulatory Visit: Payer: Self-pay | Admitting: Orthopaedic Surgery

## 2018-01-28 ENCOUNTER — Encounter: Payer: Self-pay | Admitting: Family Medicine

## 2018-01-28 ENCOUNTER — Ambulatory Visit (INDEPENDENT_AMBULATORY_CARE_PROVIDER_SITE_OTHER): Payer: Self-pay

## 2018-01-28 ENCOUNTER — Ambulatory Visit (INDEPENDENT_AMBULATORY_CARE_PROVIDER_SITE_OTHER): Payer: Self-pay | Admitting: Family Medicine

## 2018-01-28 VITALS — BP 169/86 | HR 90 | Temp 98.4°F | Ht 61.0 in | Wt 181.0 lb

## 2018-01-28 DIAGNOSIS — J181 Lobar pneumonia, unspecified organism: Secondary | ICD-10-CM

## 2018-01-28 DIAGNOSIS — Z09 Encounter for follow-up examination after completed treatment for conditions other than malignant neoplasm: Secondary | ICD-10-CM

## 2018-01-28 DIAGNOSIS — J189 Pneumonia, unspecified organism: Secondary | ICD-10-CM

## 2018-01-28 DIAGNOSIS — I10 Essential (primary) hypertension: Secondary | ICD-10-CM | POA: Insufficient documentation

## 2018-01-28 DIAGNOSIS — R011 Cardiac murmur, unspecified: Secondary | ICD-10-CM

## 2018-01-28 DIAGNOSIS — R0602 Shortness of breath: Secondary | ICD-10-CM

## 2018-01-28 MED ORDER — LEVOFLOXACIN 750 MG PO TABS: 750.0000 mg | ORAL_TABLET | Freq: Every day | ORAL | 0 refills | Status: AC

## 2018-01-28 NOTE — Progress Notes (Signed)
Subjective:    Patient ID: Danielle [name], female    DOB: 11-17-1965, 53 y.o.   MRN: [id number]  Chief Complaint:  Hospitalization Follow-up (was prescribed Carvedilol, Crestor, Lisinopril and Lasix in hospital but is not taking, caused blurry vision and nausea)   HPI: Danielle [name] is a 53 y.o. female presenting on 01/28/2018 for Hospitalization Follow-up (was prescribed Carvedilol, Crestor, Lisinopril and Lasix in hospital but is not taking, caused blurry vision and nausea)  Pt presents today for hospital discharge follow up. Today's visit is for Transitional Care Management. The patient was discharged from [facility] on 01/21/18 with a primary diagnosis of pneumonia, hypoxia, pulmonary congestion, and questionable CHF. Through chart review and discussion with the patient I have determined that management of their condition is of high complexity.   Pt states she was admitted due to shortness of breath and low oxygen saturation. States she was given IV antibiotics for pneumonia but does not recall taking oral antibiotics. Pt was not discharged home on antibiotics. Records from the referring facility indicate one dose of IV rocephin and one dose of IV Zithromax. Notes mentioned initiating oral Levaquin but nothing indicating this was started. Pt states she was told she was to follow up with cardiology due to a heart murmur and possible CHF. Pt states this has not occurred. Pt states she did not have a prior history of a heart murmur or CHF. States [facility] was unable to complete an echocardiogram during her hospital admission.  Pt states she has been doing ok since discharge. States she stopped the lasix, Crestor, and carvedilol because she was having some intermittent blurred vision and nausea. States this stopped once she stopped the medications. She denies chest pain, palpitations, orthopnea, fever, or chills. States she still has intermittent shortness of breath and fatigue. States she  feels like she has improved greatly since discharge.   Results from [facility]: CXR: diffuse bilateral airspace disease EKG: ST without ectopy or ST abnormalities BNP: 620.6 Glucose:152 Chloride: 96 CO2: 29.9 ALT: 38 Alk Phos: 116 WBC: 14.5   Relevant past medical, surgical, family, and social history reviewed and updated as indicated.  Allergies and medications reviewed and updated.   Past Medical History:  Diagnosis Date  . Anxiety   . Arthritis   . Hypertension     Past Surgical History:  Procedure Laterality Date  . arm surgery      Social History   Socioeconomic History  . Marital status: Divorced    Spouse name: Not on file  . Number of children: Not on file  . Years of education: Not on file  . Highest education level: Not on file  Occupational History  . Not on file  Social Needs  . Financial resource strain: Not on file  . Food insecurity:    Worry: Not on file    Inability: Not on file  . Transportation needs:    Medical: Not on file    Non-medical: Not on file  Tobacco Use  . Smoking status: Current Every Day Smoker    Packs/day: 1.00    Types: Cigarettes  . Smokeless tobacco: Never Used  Substance and Sexual Activity  . Alcohol use: Yes    Comment: occassionally  . Drug use: No  . Sexual activity: Never    Birth control/protection: None  Lifestyle  . Physical activity:    Days per week: Not on file    Minutes per session: Not on file  .  Stress: Not on file  Relationships  . Social connections:    Talks on phone: Not on file    Gets together: Not on file    Attends religious service: Not on file    Active member of club or organization: Not on file    Attends meetings of clubs or organizations: Not on file    Relationship status: Not on file  . Intimate partner violence:    Fear of current or ex partner: Not on file    Emotionally abused: Not on file    Physically abused: Not on file    Forced sexual activity: Not on file    Other Topics Concern  . Not on file  Social History Narrative  . Not on file    Outpatient Encounter Medications as of 01/28/2018  Medication Sig  . HYDROcodone-acetaminophen (NORCO) 7.5-325 MG tablet Take 1 tablet by mouth every 6 (six) hours as needed for moderate pain (Must last 30 days).  Marland Kitchen lisinopril (PRINIVIL,ZESTRIL) 10 MG tablet Take 1-2 tablets (10-20 mg total) by mouth daily.  Marland Kitchen levofloxacin (LEVAQUIN) 750 MG tablet Take 1 tablet (750 mg total) by mouth daily for 5 days.   No facility-administered encounter medications on file as of 01/28/2018.     Allergies  Allergen Reactions  . Penicillins Rash    Has patient had a PCN reaction causing immediate rash, facial/tongue/throat swelling, SOB or lightheadedness with hypotension: Yesyes Has patient had a PCN reaction causing severe rash involving mucus membranes or skin necrosis: No Has patient had a PCN reaction that required hospitalization No Has patient had a PCN reaction occurring within the last 10 years: No If all of the above answers are "NO", then may proceed with Cephalosporin use.     Review of Systems  Constitutional: Positive for activity change and fatigue. Negative for appetite change, chills, fever and unexpected weight change.  HENT: Negative for congestion.   Eyes: Positive for visual disturbance (stopped after quit taking medications).  Respiratory: Positive for cough and shortness of breath. Negative for apnea, choking, chest tightness, wheezing and stridor.   Cardiovascular: Negative for chest pain, palpitations and leg swelling.  Gastrointestinal: Positive for nausea. Negative for abdominal pain.  Endocrine: Negative for cold intolerance, heat intolerance, polydipsia, polyphagia and polyuria.  Genitourinary: Negative for decreased urine volume, difficulty urinating and dysuria.  Musculoskeletal: Negative for myalgias.  Neurological: Negative for dizziness, tremors, seizures, syncope, facial asymmetry,  speech difficulty, weakness, light-headedness, numbness and headaches.  Hematological: Negative for adenopathy. Does not bruise/bleed easily.  Psychiatric/Behavioral: Negative for confusion.  All other systems reviewed and are negative.       Objective:    BP (!) 169/86   Pulse 90   Temp 98.4 F (36.9 C) (Oral)   Ht 5' 1" (1.549 m)   Wt 181 lb (82.1 kg)   SpO2 96%   BMI 34.20 kg/m    Wt Readings from Last 3 Encounters:  01/28/18 181 lb (82.1 kg)  10/26/17 186 lb (84.4 kg)  10/08/16 181 lb (82.1 kg)    Physical Exam Vitals signs and nursing note reviewed.  Constitutional:      General: She is not in acute distress.    Appearance: Normal appearance. She is well-developed and well-groomed. She is obese. She is not ill-appearing or toxic-appearing.  HENT:     Head: Normocephalic and atraumatic.     Right Ear: Hearing, tympanic membrane, ear canal and external ear normal.     Left Ear: Hearing, tympanic membrane,  ear canal and external ear normal.     Nose: Nose normal.     Mouth/Throat:     Mouth: Mucous membranes are moist.     Pharynx: Oropharynx is clear. No oropharyngeal exudate or posterior oropharyngeal erythema.  Eyes:     General: Lids are normal.     Extraocular Movements: Extraocular movements intact.     Conjunctiva/sclera: Conjunctivae normal.     Pupils: Pupils are equal, round, and reactive to light.  Neck:     Musculoskeletal: Full passive range of motion without pain and neck supple.     Trachea: Trachea and phonation normal.  Cardiovascular:     Rate and Rhythm: Normal rate and regular rhythm.     Pulses: Normal pulses.     Heart sounds: Murmur present. Systolic (loudest at LUSB) murmur present with a grade of 3/6. No friction rub. No gallop. No S3 sounds.   Pulmonary:     Effort: Pulmonary effort is normal. No respiratory distress.     Breath sounds: Examination of the right-middle field reveals wheezing and rhonchi. Examination of the right-lower  field reveals wheezing and rhonchi. Examination of the left-lower field reveals rhonchi. Wheezing and rhonchi present. No rales.  Musculoskeletal:     Right lower leg: No edema.     Left lower leg: No edema.  Skin:    General: Skin is warm and dry.     Capillary Refill: Capillary refill takes less than 2 seconds.     Coloration: Skin is not cyanotic.  Neurological:     Mental Status: She is alert and oriented to person, place, and time.  Psychiatric:        Mood and Affect: Mood normal.        Behavior: Behavior normal. Behavior is cooperative.        Thought Content: Thought content normal.        Judgment: Judgment normal.     Results for orders placed or performed during the hospital encounter of 06/24/16  Urinalysis, Routine w reflex microscopic  Result Value Ref Range   Color, Urine STRAW (A) YELLOW   APPearance CLEAR CLEAR   Specific Gravity, Urine 1.004 (L) 1.005 - 1.030   pH 6.0 5.0 - 8.0   Glucose, UA NEGATIVE NEGATIVE mg/dL   Hgb urine dipstick MODERATE (A) NEGATIVE   Bilirubin Urine NEGATIVE NEGATIVE   Ketones, ur NEGATIVE NEGATIVE mg/dL   Protein, ur NEGATIVE NEGATIVE mg/dL   Nitrite NEGATIVE NEGATIVE   Leukocytes, UA MODERATE (A) NEGATIVE   RBC / HPF 6-30 0 - 5 RBC/hpf   WBC, UA 0-5 0 - 5 WBC/hpf   Bacteria, UA RARE (A) NONE SEEN   Squamous Epithelial / LPF 6-30 (A) NONE SEEN     X-Ray: Chest: Patchy perihilar and bibasilar airspace process compatible with pneumonia, worse on the right - radiologist reading   Pertinent labs & imaging results that were available during my care of the patient were reviewed by me and considered in my medical decision making.  Assessment & Plan:  Lameisha was seen today for hospitalization follow-up.  Diagnoses and all orders for this visit:  Hospital discharge follow-up -     CMP14+EGFR -     CBC with Differential/Platelet -     Brain natriuretic peptide       -     DG Chest 2 View; Future  Heart murmur, systolic -      Brain natriuretic peptide -     Ambulatory referral to  Cardiology  Essential hypertension -     CMP14+EGFR -     CBC with Differential/Platelet -     Brain natriuretic peptide -     Ambulatory referral to Cardiology  Shortness of breath -     Brain natriuretic peptide -     Ambulatory referral to Cardiology -     DG Chest 2 View; Future  Community acquired bilateral lower lobe pneumonia ([place]) Due to uncertainty of antibiotic therapy during hospital stay and continued PNA on chest xray, will treat with oral Levaquin.  -     levofloxacin (LEVAQUIN) 750 MG tablet; Take 1 tablet (750 mg total) by mouth daily for 5 days.  Pt encouraged to resume medications prescribed at discharge from hospital and to follow up with cardiology for echocardiogram. Pt advised of timing of medications and to check blood pressures if she is experiencing adverse side effects. Pt aware of signs and symptoms that warrant emergent evaluation. Pt to see PCP in 4 weeks for follow up.  Continue all other maintenance medications.  Follow up plan: Return in about 4 weeks (around 02/25/2018), or if symptoms worsen or fail to improve.  Educational handout given for heart murmur  The above assessment and management plan was discussed with the patient. The patient verbalized understanding of and has agreed to the management plan. Patient is aware to call the clinic if symptoms persist or worsen. Patient is aware when to return to the clinic for a follow-up visit. Patient educated on when it is appropriate to go to the emergency department.   Monia Pouch, FNP-C [facility] [phone]

## 2018-01-28 NOTE — Patient Instructions (Signed)
Heart Murmur A heart murmur is an extra sound that is caused by chaotic blood flow through the valves of the heart. The murmur can be heard as a "hum" or "whoosh" sound when blood flows through the heart. There are two types of heart murmurs:  Innocent (benign) murmurs. Most people with this type of heart murmur do not have a heart problem. Many children have innocent heart murmurs. Your health care provider may suggest some basic tests to find out whether your murmur is an innocent murmur. If an innocent heart murmur is found, there is no need for further tests or treatment and no need to restrict activities or stop playing sports.  Abnormal murmurs. These types of murmurs can occur in children and adults. Abnormal murmurs may be a sign of a more serious heart condition, such as a heart defect present at birth (congenital defect) or heart valve disease. What are the causes?  The heart has four areas called chambers. Valves separate the upper and lower chambers from each other (tricuspid valve and mitral valve) and separate the lower chambers of the heart from pathways that lead away from the heart (aortic valve and pulmonary valve). Normally, the valves open to let blood flow through or out of your heart, and then they shut to keep the blood from flowing backward. This condition is caused by heart valves that are not working properly.  In children, abnormal heart murmurs are typically caused by congenital defects.  In adults, abnormal murmurs are usually caused by heart valve problems from disease, infection, or aging. This condition may also be caused by:  Pregnancy.  Fever.  Overactive thyroid gland.  Anemia.  Exercise.  Rapid growth spurts (in children). What are the signs or symptoms? Innocent murmurs do not cause symptoms, and many people with abnormal murmurs may not have symptoms. If symptoms do develop, they may include:  Shortness of breath.  Blue coloring of the skin,  especially on the fingertips.  Chest pain.  Palpitations, or feeling a fluttering or skipped heartbeat.  Fainting.  Persistent cough.  Getting tired much faster than expected.  Swelling in the abdomen, feet, or ankles. How is this diagnosed? This condition may be diagnosed during a routine physical or other exam. If your health care provider hears a murmur with a stethoscope, he or she will listen for:  Where the murmur is located in your heart.  How long the murmur lasts (duration).  When the murmur is heard during the heartbeat.  How loud the murmur is. This may help the health care provider figure out what is causing the murmur. You may be referred to a heart specialist (cardiologist). You may also have other tests, including:  Electrocardiogram (ECG or EKG). This test measures the electrical activity of your heart.  Echocardiogram. This test uses high frequency sound waves to make pictures of your heart.  MRI or chest X-ray.  Cardiac catheterization. This test looks at blood flow through the arteries around the heart. For children and adults who have an abnormal heart murmur and want to stay active, it is important to:  Complete testing.  Review test results.  Receive recommendations from your health care provider. If heart disease is present, it may not be safe to play or be active. How is this treated? Heart murmurs themselves do not need treatment. In some cases, a heart murmur may go away on its own. If an underlying problem or disease is causing the murmur, you may need treatment. If treatment   is needed, it will depend on the type and severity of the disease or heart problem causing the murmur. Treatment may include:  Medicine.  Surgery.  Dietary and lifestyle changes. Follow these instructions at home:  Talk with your health care provider before participating in sports or other activities that require a lot of effort and energy (are strenuous).  Learn as  much as possible about your condition and any related diseases. Ask your health care provider if you may be at risk for any medical emergencies.  Talk with your health care provider about what symptoms you should look out for.  It is up to you to get your test results. Ask your health care provider, or the department that is doing the test, when your results will be ready.  Keep all follow-up visits as told by your health care provider. This is important. Contact a health care provider if:  You are frequently short of breath.  You feel more tired than usual.  You are having a hard time keeping up with normal activities or fitness routines.  You have swelling in your ankles or feet.  You notice that your heart often beats irregularly.  You develop any new symptoms. Get help right away if:  You have chest pain.  You are having trouble breathing.  You feel light-headed or you pass out.  Your symptoms suddenly get worse. These symptoms may represent a serious problem that is an emergency. Do not wait to see if the symptoms will go away. Get medical help right away. Call your local emergency services (911 in the [place] Do not drive yourself to the hospital. Summary  Normally, the heart valves open to let blood flow through or out of your heart, and then they shut to keep the blood from flowing backward.  A heart murmur is caused by heart valves that are not working properly.  You may need treatment if an underlying problem or disease is causing the heart murmur. Treatment may include medicine, surgery, or dietary and lifestyle changes.  Talk with your health care provider before participating in sports or other activities that require a lot of effort and energy (are strenuous).  Talk with your health care provider about what symptoms you should watch out for. This information is not intended to replace advice given to you by your health care provider. Make sure you discuss any  questions you have with your health care provider. Document Released: 02/20/2004 Document Revised: 07/06/2017 Document Reviewed: 07/06/2017 Elsevier Interactive Patient Education  2019 [employer]  

## 2018-01-29 LAB — CBC WITH DIFFERENTIAL/PLATELET
Basophils Absolute: 0.1 10*3/uL (ref 0.0–0.2)
Basos: 1 %
EOS (ABSOLUTE): 0.3 10*3/uL (ref 0.0–0.4)
Eos: 2 %
Hematocrit: 35.1 % (ref 34.0–46.6)
Hemoglobin: 12.2 g/dL (ref 11.1–15.9)
Immature Grans (Abs): 0.1 10*3/uL (ref 0.0–0.1)
Immature Granulocytes: 1 %
Lymphocytes Absolute: 2.5 10*3/uL (ref 0.7–3.1)
Lymphs: 19 %
MCH: 30.5 pg (ref 26.6–33.0)
MCHC: 34.8 g/dL (ref 31.5–35.7)
MCV: 88 fL (ref 79–97)
Monocytes Absolute: 1.1 10*3/uL — ABNORMAL HIGH (ref 0.1–0.9)
Monocytes: 8 %
Neutrophils Absolute: 9.6 10*3/uL — ABNORMAL HIGH (ref 1.4–7.0)
Neutrophils: 69 %
PLATELETS: 347 10*3/uL (ref 150–450)
RBC: 4 x10E6/uL (ref 3.77–5.28)
RDW: 12.3 % (ref 12.3–15.4)
WBC: 13.6 10*3/uL — AB (ref 3.4–10.8)

## 2018-01-29 LAB — CMP14+EGFR
ALT: 18 IU/L (ref 0–32)
AST: 16 IU/L (ref 0–40)
Albumin/Globulin Ratio: 1.4 (ref 1.2–2.2)
Albumin: 3.5 g/dL (ref 3.5–5.5)
Alkaline Phosphatase: 74 IU/L (ref 39–117)
BUN/Creatinine Ratio: 28 — ABNORMAL HIGH (ref 9–23)
BUN: 18 mg/dL (ref 6–24)
Bilirubin Total: 0.2 mg/dL (ref 0.0–1.2)
CO2: 23 mmol/L (ref 20–29)
Calcium: 8.8 mg/dL (ref 8.7–10.2)
Chloride: 104 mmol/L (ref 96–106)
Creatinine, Ser: 0.64 mg/dL (ref 0.57–1.00)
GFR calc Af Amer: 120 mL/min/{1.73_m2} (ref >59)
GFR calc non Af Amer: 104 mL/min/{1.73_m2} (ref >59)
Globulin, Total: 2.5 g/dL (ref 1.5–4.5)
Glucose: 95 mg/dL (ref 65–99)
Potassium: 3.7 mmol/L (ref 3.5–5.2)
Sodium: 142 mmol/L (ref 134–144)
Total Protein: 6 g/dL (ref 6.0–8.5)

## 2018-01-29 LAB — BRAIN NATRIURETIC PEPTIDE: BNP: 51.1 pg/mL (ref 0.0–100.0)

## 2018-02-25 ENCOUNTER — Ambulatory Visit: Payer: Self-pay | Admitting: Physician Assistant

## 2018-02-28 ENCOUNTER — Ambulatory Visit: Payer: Self-pay | Admitting: Cardiology

## 2018-03-15 ENCOUNTER — Ambulatory Visit: Payer: Self-pay | Admitting: Physician Assistant

## 2018-03-25 ENCOUNTER — Ambulatory Visit: Payer: Self-pay | Admitting: Cardiology

## 2018-03-29 ENCOUNTER — Ambulatory Visit: Payer: Self-pay | Admitting: Physician Assistant

## 2018-04-27 ENCOUNTER — Telehealth: Payer: Self-pay | Admitting: *Deleted

## 2018-04-27 NOTE — Telephone Encounter (Signed)
Pt contacted per Dr Wyline Mood. History reviewed. No symptoms to suggest any unstable cardiac conditions. Based on discussion, with current pandemic situation, we have postponed 04/29/18 appointment until June 2020. If symptoms change, pt has been instructed to contact our office - pt declined telehealth at this time

## 2018-04-29 ENCOUNTER — Ambulatory Visit: Payer: Self-pay | Admitting: Cardiology

## 2018-06-03 ENCOUNTER — Other Ambulatory Visit: Payer: Self-pay

## 2018-06-06 ENCOUNTER — Ambulatory Visit (INDEPENDENT_AMBULATORY_CARE_PROVIDER_SITE_OTHER): Payer: Self-pay

## 2018-06-06 ENCOUNTER — Ambulatory Visit (INDEPENDENT_AMBULATORY_CARE_PROVIDER_SITE_OTHER): Payer: Self-pay | Admitting: Family Medicine

## 2018-06-06 ENCOUNTER — Other Ambulatory Visit: Payer: Self-pay

## 2018-06-06 ENCOUNTER — Encounter: Payer: Self-pay | Admitting: Family Medicine

## 2018-06-06 VITALS — BP 190/103 | HR 80 | Temp 97.6°F | Ht 61.0 in | Wt 180.0 lb

## 2018-06-06 DIAGNOSIS — G8929 Other chronic pain: Secondary | ICD-10-CM

## 2018-06-06 DIAGNOSIS — R52 Pain, unspecified: Secondary | ICD-10-CM

## 2018-06-06 DIAGNOSIS — M545 Low back pain: Secondary | ICD-10-CM

## 2018-06-06 DIAGNOSIS — M5441 Lumbago with sciatica, right side: Secondary | ICD-10-CM

## 2018-06-06 MED ORDER — PREDNISONE 10 MG (21) PO TBPK: ORAL_TABLET | ORAL | 0 refills | Status: DC

## 2018-06-06 MED ORDER — METHYLPREDNISOLONE ACETATE 80 MG/ML IJ SUSP
80.0000 mg | Freq: Once | INTRAMUSCULAR | Status: AC
  Administered 2018-06-06: 80 mg via INTRAMUSCULAR

## 2018-06-06 MED ORDER — HYDROCODONE-ACETAMINOPHEN 5-325 MG PO TABS: 1.0000 | ORAL_TABLET | Freq: Three times a day (TID) | ORAL | 0 refills | Status: DC | PRN

## 2018-06-06 NOTE — Patient Instructions (Signed)
You have a scoliotic curve in your back that is likely contributing to your pain.  I saw quite a bit of arthritis in your back on x-ray as well.  We discussed considering seeing a back specialist again but you would like to hold off on this for now.   You are given a shot of steroids today.    Start the prednisone tomorrow with breakfast.    I have given you a very short course of narcotic pain medication to have on hand.  Use this very sparingly.    Do not work or drive or operate heavy machinery while taking the hydrocodone, it can cause sedation.    You must use a stool softener while taking the hydrocodone.  See Lawanna Kobus in 1 week for blood pressure check.

## 2018-06-06 NOTE — Progress Notes (Signed)
Subjective: CC: Back pain PCP: Remus Loffler, PA-C SRP:Danielle [name] is a 53 y.o. female presenting to clinic today for:  1. Back pain Patient reports longstanding history of intermittent back pain that occurs in the midline of the low back and radiates to the right buttock and right lower extremity.  She describes it as a throbbing pain.  She reports that lying flat can make it worse.  She certainly feels that the back becomes tighter if she lies flat.  Denies any sensation changes including saddle anesthesia.  No fecal incontinence, urinary retention or falls.  No preceding injury.  She had an MRI of her back several years ago which showed degenerative changes but notes that the specialist was not recommending any interventions at that time.  It flares intermittently and she is not yet ready to pursue repeat visit with specialist.  Most the time she can handle the back pain but sometimes it requires steroids to get the flare down.  Pain currently is 8/10.  Patient denies any chest pain, shortness of breath, lower extremity edema or headache  ROS: Per HPI  Allergies  Allergen Reactions  . Penicillins Rash    Has patient had a PCN reaction causing immediate rash, facial/tongue/throat swelling, SOB or lightheadedness with hypotension: Yesyes Has patient had a PCN reaction causing severe rash involving mucus membranes or skin necrosis: No Has patient had a PCN reaction that required hospitalization No Has patient had a PCN reaction occurring within the last 10 years: No If all of the above answers are "NO", then may proceed with Cephalosporin use.    Past Medical History:  Diagnosis Date  . Anxiety   . Arthritis   . Hypertension     Current Outpatient Medications:  .  lisinopril (PRINIVIL,ZESTRIL) 10 MG tablet, Take 1-2 tablets (10-20 mg total) by mouth daily., Disp: 60 tablet, Rfl: 5 Social History   Socioeconomic History  . Marital status: Divorced    Spouse name: Not on file   . Number of children: Not on file  . Years of education: Not on file  . Highest education level: Not on file  Occupational History  . Not on file  Social Needs  . Financial resource strain: Not on file  . Food insecurity:    Worry: Not on file    Inability: Not on file  . Transportation needs:    Medical: Not on file    Non-medical: Not on file  Tobacco Use  . Smoking status: Current Every Day Smoker    Packs/day: 1.00    Types: Cigarettes  . Smokeless tobacco: Never Used  Substance and Sexual Activity  . Alcohol use: Yes    Comment: occassionally  . Drug use: No  . Sexual activity: Never    Birth control/protection: None  Lifestyle  . Physical activity:    Days per week: Not on file    Minutes per session: Not on file  . Stress: Not on file  Relationships  . Social connections:    Talks on phone: Not on file    Gets together: Not on file    Attends religious service: Not on file    Active member of club or organization: Not on file    Attends meetings of clubs or organizations: Not on file    Relationship status: Not on file  . Intimate partner violence:    Fear of current or ex partner: Not on file    Emotionally abused: Not on file  Physically abused: Not on file    Forced sexual activity: Not on file  Other Topics Concern  . Not on file  Social History Narrative  . Not on file   Family History  Problem Relation Age of Onset  . Hypertension Mother     Objective: Office vital signs reviewed. BP (!) 190/103   Pulse 80   Temp 97.6 F (36.4 C) (Oral)   Ht 5\' 1"  (1.549 m)   Wt 180 lb (81.6 kg)   BMI 34.01 kg/m   Physical Examination:  General: Awake, alert, well nourished, No acute distress Extremities: warm, well perfused, No edema, cyanosis or clubbing; +2 pulses bilaterally MSK: antlagic gait and station  Lumbar spine: No midline tenderness palpation.  She has moderate tenderness palpation along the left and right paraspinal muscles.  There is a  palpable scoliotic curve to the left noted in the lumbosacral region.  Negative straight leg raise. Skin: dry; intact; no rashes or lesions Neuro: 4/5 bilateral lower extremity strength with right lower extremity being slightly weaker than left.bilateral lower extremity light touch sensation grossly intact, patellar DTRs 2/4 bilaterally  No results found.  Assessment/ Plan: 53 y.o. female   1. Chronic bilateral low back pain with right-sided sciatica Personal review of lumbar spine plain films demonstrated bone spurs throughout the lower lumbar.  She also has a scoliotic curve to the left noted.  I suspect that she has a level of impingement in this area that may be contributing to radicular symptoms in the right lower extremity.  At this time, she demonstrates no neurologic abnormalities.  We will proceed with corticosteroid injection, prednisone tomorrow morning and Norco as needed severe pain.  The national narcotic database was reviewed and there have been no refills of any narcotics since 2019.  No red flags.  Caution sedation with Norco should she use this.  I offered referral to specialist but patient declined today.  She will follow-up with PCP in 1 week for elevated blood pressures. - DG Lumbar Spine 2-3 Views; Future - HYDROcodone-acetaminophen (NORCO) 5-325 MG tablet; Take 1 tablet by mouth every 8 (eight) hours as needed for severe pain.  Dispense: 15 tablet; Refill: 0 - methylPREDNISolone acetate (DEPO-MEDROL) injection 80 mg - predniSONE (STERAPRED UNI-PAK 21 TAB) 10 MG (21) TBPK tablet; As directed x 6 days  Dispense: 21 tablet; Refill: 0   Orders Placed This Encounter  Procedures  . DG Lumbar Spine 2-3 Views    Standing Status:   Future    Number of Occurrences:   1    Standing Expiration Date:   08/06/2019    Order Specific Question:   Reason for Exam (SYMPTOM  OR DIAGNOSIS REQUIRED)    Answer:   pain    Order Specific Question:   Is the patient pregnant?    Answer:   No     Order Specific Question:   Preferred imaging location?    Answer:   Internal   No orders of the defined types were placed in this encounter.    Raliegh IpAshly M , DO [facility] [phone]

## 2018-06-06 NOTE — Progress Notes (Signed)
lumbar 

## 2018-06-15 ENCOUNTER — Ambulatory Visit: Payer: Self-pay | Admitting: Physician Assistant

## 2018-06-22 ENCOUNTER — Other Ambulatory Visit: Payer: Self-pay | Admitting: Family Medicine

## 2018-06-22 DIAGNOSIS — G8929 Other chronic pain: Secondary | ICD-10-CM

## 2018-06-22 DIAGNOSIS — M5441 Lumbago with sciatica, right side: Secondary | ICD-10-CM

## 2018-06-22 MED ORDER — PREDNISONE 10 MG (21) PO TBPK: ORAL_TABLET | ORAL | 0 refills | Status: DC

## 2018-07-01 ENCOUNTER — Ambulatory Visit: Payer: Self-pay | Admitting: Physician Assistant

## 2018-07-08 ENCOUNTER — Ambulatory Visit: Payer: Self-pay | Admitting: Cardiology

## 2018-07-26 ENCOUNTER — Ambulatory Visit (INDEPENDENT_AMBULATORY_CARE_PROVIDER_SITE_OTHER): Payer: Self-pay | Admitting: Physician Assistant

## 2018-07-26 ENCOUNTER — Encounter: Payer: Self-pay | Admitting: Physician Assistant

## 2018-07-26 ENCOUNTER — Other Ambulatory Visit: Payer: Self-pay

## 2018-07-26 ENCOUNTER — Telehealth: Payer: Self-pay | Admitting: Physician Assistant

## 2018-07-26 DIAGNOSIS — G8929 Other chronic pain: Secondary | ICD-10-CM

## 2018-07-26 DIAGNOSIS — M5441 Lumbago with sciatica, right side: Secondary | ICD-10-CM

## 2018-07-26 DIAGNOSIS — J40 Bronchitis, not specified as acute or chronic: Secondary | ICD-10-CM

## 2018-07-26 MED ORDER — DOXYCYCLINE HYCLATE 100 MG PO TABS: 100.0000 mg | ORAL_TABLET | Freq: Two times a day (BID) | ORAL | 0 refills | Status: DC

## 2018-07-26 MED ORDER — LISINOPRIL 10 MG PO TABS: 10.0000 mg | ORAL_TABLET | Freq: Every day | ORAL | 5 refills | Status: DC

## 2018-07-26 MED ORDER — PREDNISONE 10 MG (21) PO TBPK: ORAL_TABLET | ORAL | 0 refills | Status: DC

## 2018-07-26 NOTE — Progress Notes (Signed)
Telephone visit  Subjective: ZO:XWRUEAVWUCC:sinusitis PCP: Remus LofflerJones, Djeneba Barsch S, PA-C JWJ:XBJYNWHPI:[name] Danielle is a 53 y.o. Danielle [name] calls for telephone consult today. Patient provides verbal consent for consult held via phone.  Patient is identified with 2 separate identifiers.  At this time the entire area is on COVID-19 social distancing and stay home orders are in place.  Patient is of higher risk and therefore we are performing this by a virtual method.  Location of patient: work Location of provider: [facility] Others present for call: no  This patient has had many days of sore throat and postnasal drainage, headache at times and sinus pressure. There is copious drainage at times. Denies any fever at this time. There has been a history of sinus infections in the past.  There is cough at night. It has become more prevalent in recent days.  In the past she has developed bronchitis on a frequent basis and has even had to use prednisone.  She has never had to use an inhaler.  ROS: Per HPI  Allergies  Allergen Reactions  . Penicillins Rash    Has patient had a PCN reaction causing immediate rash, facial/tongue/throat swelling, SOB or lightheadedness with hypotension: Yesyes Has patient had a PCN reaction causing severe rash involving mucus membranes or skin necrosis: No Has patient had a PCN reaction that required hospitalization No Has patient had a PCN reaction occurring within the last 10 years: No If all of the above answers are "NO", then may proceed with Cephalosporin use.    Past Medical History:  Diagnosis Date  . Anxiety   . Arthritis   . Hypertension     Current Outpatient Medications:  .  doxycycline (VIBRA-TABS) 100 MG tablet, Take 1 tablet (100 mg total) by mouth 2 (two) times daily. 1 po bid, Disp: 20 tablet, Rfl: 0 .  HYDROcodone-acetaminophen (NORCO) 5-325 MG tablet, Take 1 tablet by mouth every 8 (eight) hours as needed for severe pain., Disp: 15 tablet, Rfl: 0 .  lisinopril  (ZESTRIL) 10 MG tablet, Take 1-2 tablets (10-20 mg total) by mouth daily., Disp: 60 tablet, Rfl: 5 .  predniSONE (STERAPRED UNI-PAK 21 TAB) 10 MG (21) TBPK tablet, As directed x 6 days, Disp: 21 tablet, Rfl: 0  Assessment/ Plan: 53 y.o. Danielle [name]   1. Chronic bilateral low back pain with right-sided sciatica - predniSONE (STERAPRED UNI-PAK 21 TAB) 10 MG (21) TBPK tablet; As directed x 6 days  Dispense: 21 tablet; Refill: 0  2. Bronchitis - doxycycline (VIBRA-TABS) 100 MG tablet; Take 1 tablet (100 mg total) by mouth 2 (two) times daily. 1 po bid  Dispense: 20 tablet; Refill: 0 - lisinopril (ZESTRIL) 10 MG tablet; Take 1-2 tablets (10-20 mg total) by mouth daily.  Dispense: 60 tablet; Refill: 5   Continue all other maintenance medications as listed above.  Start time: 2:08 PM End time: 2:14 PM  Meds ordered this encounter  Medications  . doxycycline (VIBRA-TABS) 100 MG tablet    Sig: Take 1 tablet (100 mg total) by mouth 2 (two) times daily. 1 po bid    Dispense:  20 tablet    Refill:  0    Order Specific Question:   Supervising Provider    Answer:   Raliegh IpGOTTSCHALK, ASHLY M [[id number]]  . predniSONE (STERAPRED UNI-PAK 21 TAB) 10 MG (21) TBPK tablet    Sig: As directed x 6 days    Dispense:  21 tablet    Refill:  0  Order Specific Question:   Supervising Provider    Answer:   Janora Norlander [[id number]]  . lisinopril (ZESTRIL) 10 MG tablet    Sig: Take 1-2 tablets (10-20 mg total) by mouth daily.    Dispense:  60 tablet    Refill:  5    Order Specific Question:   Supervising Provider    Answer:   Janora Norlander [[id number]]    Particia Nearing PA-C [place] [phone]

## 2018-08-11 ENCOUNTER — Ambulatory Visit (INDEPENDENT_AMBULATORY_CARE_PROVIDER_SITE_OTHER): Payer: Self-pay | Admitting: Orthopaedic Surgery

## 2018-08-11 ENCOUNTER — Other Ambulatory Visit: Payer: Self-pay

## 2018-08-11 ENCOUNTER — Encounter: Payer: Self-pay | Admitting: Orthopaedic Surgery

## 2018-08-11 DIAGNOSIS — M25562 Pain in left knee: Secondary | ICD-10-CM

## 2018-08-11 DIAGNOSIS — F17200 Nicotine dependence, unspecified, uncomplicated: Secondary | ICD-10-CM

## 2018-08-11 DIAGNOSIS — G8929 Other chronic pain: Secondary | ICD-10-CM

## 2018-08-11 MED ORDER — HYDROCODONE-ACETAMINOPHEN 7.5-325 MG PO TABS: ORAL_TABLET | ORAL | 0 refills | Status: DC

## 2018-08-11 NOTE — Progress Notes (Signed)
PROCEDURE NOTE:  The patient requests injections of the left knee , verbal consent was obtained.  The left knee was prepped appropriately after time out was performed.   Sterile technique was observed and injection of 1 cc of Depo-Medrol 40 mg with several cc's of plain xylocaine. Anesthesia was provided by ethyl chloride and a 20-gauge needle was used to inject the knee area. The injection was tolerated well.  A band aid dressing was applied.  The patient was advised to apply ice later today and tomorrow to the injection sight as needed.  I have reviewed the [place] web site prior to prescribing narcotic medicine for this patient.   Electronically Signed Sanjuana Kava, MD 7/16/20209:08 AM

## 2018-08-11 NOTE — Patient Instructions (Signed)

## 2018-08-19 ENCOUNTER — Encounter: Payer: Self-pay | Admitting: Physician Assistant

## 2018-08-19 ENCOUNTER — Ambulatory Visit (INDEPENDENT_AMBULATORY_CARE_PROVIDER_SITE_OTHER): Payer: Self-pay | Admitting: Physician Assistant

## 2018-08-19 ENCOUNTER — Other Ambulatory Visit: Payer: Self-pay

## 2018-08-19 DIAGNOSIS — J40 Bronchitis, not specified as acute or chronic: Secondary | ICD-10-CM

## 2018-08-19 MED ORDER — DOXYCYCLINE HYCLATE 100 MG PO TABS: 100.0000 mg | ORAL_TABLET | Freq: Two times a day (BID) | ORAL | 0 refills | Status: DC

## 2018-08-19 MED ORDER — PREDNISONE 10 MG (48) PO TBPK: ORAL_TABLET | ORAL | 0 refills | Status: DC

## 2018-08-19 NOTE — Progress Notes (Signed)
Telephone visit  Subjective: OZ:[id number] PCP: Terald Sleeper, PA-C QVZ:DGLOVF Danielle GRABSKI is a 53 y.o. female calls for telephone consult today. Patient provides verbal consent for consult held via phone.  Patient is identified with 2 separate identifiers.  At this time the entire area is on COVID-19 social distancing and stay home orders are in place.  Patient is of higher risk and therefore we are performing this by a virtual method.  Location of patient: home Location of provider: HOME Others present for call: no  She reports that she had been treated a couple weeks ago for bronchitis.  She does frequently get these type of infections.  Patient is a smoker.  She completed the course of doxycycline and 6-day prednisone pack and she states that she did seem somewhat better but then has relapsed again.  Patient with several days of progressing upper respiratory and bronchial symptoms. Initially there was more upper respiratory congestion. This progressed to having significant cough that is productive throughout the day and severe at night. There is occasional wheezing after coughing. Sometimes there is slight dyspnea on exertion. It is productive mucus that is yellow in color. Denies any blood.    ROS: Per HPI  Allergies  Allergen Reactions  . Penicillins Rash    Has patient had a PCN reaction causing immediate rash, facial/tongue/throat swelling, SOB or lightheadedness with hypotension: Yesyes Has patient had a PCN reaction causing severe rash involving mucus membranes or skin necrosis: No Has patient had a PCN reaction that required hospitalization No Has patient had a PCN reaction occurring within the last 10 years: No If all of the above answers are "NO", then may proceed with Cephalosporin use.    Past Medical History:  Diagnosis Date  . Anxiety   . Arthritis   . Hypertension     Current Outpatient Medications:  .  doxycycline (VIBRA-TABS) 100 MG tablet, Take 1 tablet  (100 mg total) by mouth 2 (two) times daily. 1 po bid, Disp: 20 tablet, Rfl: 0 .  HYDROcodone-acetaminophen (NORCO) 7.5-325 MG tablet, One tablet every four hours as needed for pain.  Five day supply for acute pain per [place] [address] Disp: 30 tablet, Rfl: 0 .  lisinopril (ZESTRIL) 10 MG tablet, Take 1-2 tablets (10-20 mg total) by mouth daily., Disp: 60 tablet, Rfl: 5 .  predniSONE (STERAPRED UNI-PAK 48 TAB) 10 MG (48) TBPK tablet, Take as directed for 12 days, Disp: 48 tablet, Rfl: 0  Assessment/ Plan: 53 y.o. female   1. Bronchitis - doxycycline (VIBRA-TABS) 100 MG tablet; Take 1 tablet (100 mg total) by mouth 2 (two) times daily. 1 po bid  Dispense: 20 tablet; Refill: 0 - predniSONE (STERAPRED UNI-PAK 48 TAB) 10 MG (48) TBPK tablet; Take as directed for 12 days  Dispense: 48 tablet; Refill: 0   No follow-ups on file.  Continue all other maintenance medications as listed above.  Start time: 2:35 AM End time: 2:40 PM  Meds ordered this encounter  Medications  . doxycycline (VIBRA-TABS) 100 MG tablet    Sig: Take 1 tablet (100 mg total) by mouth 2 (two) times daily. 1 po bid    Dispense:  20 tablet    Refill:  0    Order Specific Question:   Supervising Provider    Answer:   Janora Norlander [[id number]]  . predniSONE (STERAPRED UNI-PAK 48 TAB) 10 MG (48) TBPK tablet    Sig: Take as directed for 12 days    Dispense:  48 tablet    Refill:  0    Order Specific Question:   Supervising Provider    Answer:   Raliegh IpGOTTSCHALK, ASHLY M [[id number]]    Prudy FeelerAngel Annlouise Gerety PA-C [facility] [phone]

## 2018-09-07 ENCOUNTER — Other Ambulatory Visit: Payer: Self-pay

## 2018-09-07 MED ORDER — HYDROCODONE-ACETAMINOPHEN 7.5-325 MG PO TABS: ORAL_TABLET | ORAL | 0 refills | Status: DC

## 2018-10-06 ENCOUNTER — Other Ambulatory Visit: Payer: Self-pay

## 2018-10-07 MED ORDER — HYDROCODONE-ACETAMINOPHEN 5-325 MG PO TABS: ORAL_TABLET | ORAL | 0 refills | Status: DC

## 2018-11-16 ENCOUNTER — Other Ambulatory Visit: Payer: Self-pay

## 2018-11-16 MED ORDER — HYDROCODONE-ACETAMINOPHEN 5-325 MG PO TABS: ORAL_TABLET | ORAL | 0 refills | Status: DC

## 2018-12-14 ENCOUNTER — Other Ambulatory Visit: Payer: Self-pay

## 2018-12-15 MED ORDER — HYDROCODONE-ACETAMINOPHEN 5-325 MG PO TABS: ORAL_TABLET | ORAL | 0 refills | Status: DC

## 2019-01-18 ENCOUNTER — Other Ambulatory Visit: Payer: Self-pay

## 2019-01-18 MED ORDER — HYDROCODONE-ACETAMINOPHEN 5-325 MG PO TABS: ORAL_TABLET | ORAL | 0 refills | Status: DC

## 2019-02-15 ENCOUNTER — Other Ambulatory Visit: Payer: Self-pay

## 2019-02-16 MED ORDER — HYDROCODONE-ACETAMINOPHEN 5-325 MG PO TABS: ORAL_TABLET | ORAL | 0 refills | Status: DC

## 2019-03-15 ENCOUNTER — Other Ambulatory Visit: Payer: Self-pay | Admitting: Orthopaedic Surgery

## 2019-03-15 MED ORDER — HYDROCODONE-ACETAMINOPHEN 5-325 MG PO TABS: ORAL_TABLET | ORAL | 0 refills | Status: DC

## 2019-04-11 ENCOUNTER — Other Ambulatory Visit: Payer: Self-pay | Admitting: Orthopaedic Surgery

## 2019-04-11 MED ORDER — HYDROCODONE-ACETAMINOPHEN 5-325 MG PO TABS: ORAL_TABLET | ORAL | 0 refills | Status: DC

## 2019-05-09 ENCOUNTER — Other Ambulatory Visit: Payer: Self-pay | Admitting: Orthopaedic Surgery

## 2019-05-09 NOTE — Telephone Encounter (Signed)
I have not seen since last July, 2020.  I cannot refill narcotic.

## 2019-05-11 ENCOUNTER — Other Ambulatory Visit: Payer: Self-pay | Admitting: Orthopaedic Surgery

## 2019-05-11 NOTE — Telephone Encounter (Signed)
I have not seen her in some time.  She needs to be seen to get narcotics.  I missed that the last refill.

## 2019-06-06 ENCOUNTER — Encounter: Payer: Self-pay | Admitting: Orthopaedic Surgery

## 2019-06-06 ENCOUNTER — Other Ambulatory Visit: Payer: Self-pay

## 2019-06-06 ENCOUNTER — Ambulatory Visit: Payer: Self-pay | Admitting: Orthopaedic Surgery

## 2019-06-06 DIAGNOSIS — M25562 Pain in left knee: Secondary | ICD-10-CM

## 2019-06-06 DIAGNOSIS — G8929 Other chronic pain: Secondary | ICD-10-CM

## 2019-06-06 DIAGNOSIS — F17200 Nicotine dependence, unspecified, uncomplicated: Secondary | ICD-10-CM

## 2019-06-06 MED ORDER — HYDROCODONE-ACETAMINOPHEN 5-325 MG PO TABS: ORAL_TABLET | ORAL | 0 refills | Status: DC

## 2019-06-06 NOTE — Progress Notes (Signed)
PROCEDURE NOTE:  The patient requests injections of the left knee , verbal consent was obtained.  The left knee was prepped appropriately after time out was performed.   Sterile technique was observed and injection of 1 cc of Depo-Medrol 40 mg with several cc's of plain xylocaine. Anesthesia was provided by ethyl chloride and a 20-gauge needle was used to inject the knee area. The injection was tolerated well.  A band aid dressing was applied.  The patient was advised to apply ice later today and tomorrow to the injection sight as needed.  I have reviewed the [place] Controlled Substance Reporting System web site prior to prescribing narcotic medicine for this patient.   Return as needed.  Electronically Signed Darreld Mclean, MD 5/11/202110:47 AM

## 2019-06-30 ENCOUNTER — Telehealth: Payer: Self-pay

## 2019-06-30 NOTE — Telephone Encounter (Signed)
Patient called stating she has been having cough, congestion and 02 has been running around 93.  First opening we have is 07/03/19- appointment scheduled.  Patient aware if she feels worse or her O2 drops she needs to go to the ER. Patient verbalizes understanding.

## 2019-07-03 ENCOUNTER — Encounter: Payer: Self-pay | Admitting: Family Medicine

## 2019-07-03 ENCOUNTER — Ambulatory Visit (INDEPENDENT_AMBULATORY_CARE_PROVIDER_SITE_OTHER): Payer: Self-pay | Admitting: Family Medicine

## 2019-07-03 DIAGNOSIS — J4521 Mild intermittent asthma with (acute) exacerbation: Secondary | ICD-10-CM

## 2019-07-03 MED ORDER — ALBUTEROL SULFATE HFA 108 (90 BASE) MCG/ACT IN AERS: 2.0000 | INHALATION_SPRAY | Freq: Four times a day (QID) | RESPIRATORY_TRACT | 0 refills | Status: DC | PRN

## 2019-07-03 MED ORDER — PREDNISONE 20 MG PO TABS: ORAL_TABLET | ORAL | 0 refills | Status: DC

## 2019-07-03 MED ORDER — AZITHROMYCIN 250 MG PO TABS: ORAL_TABLET | ORAL | 0 refills | Status: DC

## 2019-07-03 NOTE — Progress Notes (Signed)
Virtual Visit via telephone Note  I connected with Danielle [name] on 07/03/19 at 0940 by telephone and verified that I am speaking with the correct person using two identifiers. Danielle [name] is currently located at home and no other people are currently with her during visit. The provider, Elige Radon Evelyna Folker, MD is located in their office at time of visit.  Call ended at [phone]  I discussed the limitations, risks, security and privacy concerns of performing an evaluation and management service by telephone and the availability of in person appointments. I also discussed with the patient that there may be a patient responsible charge related to this service. The patient expressed understanding and agreed to proceed.   History and Present Illness: Patient is calling in for cough and congestion and is stopped up and nose is running.  She started about 5 days ago.  She has used Catering manager and advil.  She denies any fevers or chills.  She has some wheezing but no shortness of breath.  She denies any sick contacts and has been vaccinated. She is 1ppd smoker for about 35 years.   No diagnosis found.  Outpatient Encounter Medications as of 07/03/2019  Medication Sig  . HYDROcodone-acetaminophen (NORCO/VICODIN) 5-325 MG tablet One tablet every six hours for pain.  Limit 7 days.  Marland Kitchen lisinopril (ZESTRIL) 10 MG tablet Take 1-2 tablets (10-20 mg total) by mouth daily.   No facility-administered encounter medications on file as of 07/03/2019.    Review of Systems  Constitutional: Negative for chills and fever.  HENT: Positive for congestion, postnasal drip and rhinorrhea. Negative for ear discharge, ear pain, sinus pressure, sneezing and sore throat.   Eyes: Negative for visual disturbance.  Respiratory: Positive for cough and wheezing. Negative for chest tightness and shortness of breath.   Cardiovascular: Negative for chest pain and leg swelling.  Skin: Negative for rash.  All other systems  reviewed and are negative.   Observations/Objective: Patient sounds comfortable and in no acute distress  Assessment and Plan: Problem List Items Addressed This Visit    None    Visit Diagnoses    Mild intermittent reactive airway disease with acute exacerbation    -  Primary   Relevant Medications   albuterol (VENTOLIN HFA) 108 (90 Base) MCG/ACT inhaler   azithromycin (ZITHROMAX) 250 MG tablet   predniSONE (DELTASONE) 20 MG tablet    Patient chronic smoker, does not have insurance so has not had any real testing but it sounds like she might be developing some COPD so we will treat it like a COPD exacerbation.  Follow up plan: Return if symptoms worsen or fail to improve.     I discussed the assessment and treatment plan with the patient. The patient was provided an opportunity to ask questions and all were answered. The patient agreed with the plan and demonstrated an understanding of the instructions.   The patient was advised to call back or seek an in-person evaluation if the symptoms worsen or if the condition fails to improve as anticipated.  The above assessment and management plan was discussed with the patient. The patient verbalized understanding of and has agreed to the management plan. Patient is aware to call the clinic if symptoms persist or worsen. Patient is aware when to return to the clinic for a follow-up visit. Patient educated on when it is appropriate to go to the emergency department.    I provided 9 minutes of non-face-to-face time during this encounter.  Worthy Rancher, MD

## 2019-07-05 ENCOUNTER — Other Ambulatory Visit: Payer: Self-pay | Admitting: Orthopaedic Surgery

## 2019-07-05 MED ORDER — HYDROCODONE-ACETAMINOPHEN 5-325 MG PO TABS: ORAL_TABLET | ORAL | 0 refills | Status: DC

## 2019-08-01 ENCOUNTER — Other Ambulatory Visit: Payer: Self-pay | Admitting: Orthopaedic Surgery

## 2019-08-01 MED ORDER — HYDROCODONE-ACETAMINOPHEN 5-325 MG PO TABS: ORAL_TABLET | ORAL | 0 refills | Status: DC

## 2019-08-02 IMAGING — DX DG CHEST 2V
2 series · 2 of 2 positions shown · non-contrast
Comparison: None available

CLINICAL DATA: Pneumonia

EXAM:
CHEST - 2 VIEW

[chest pa]
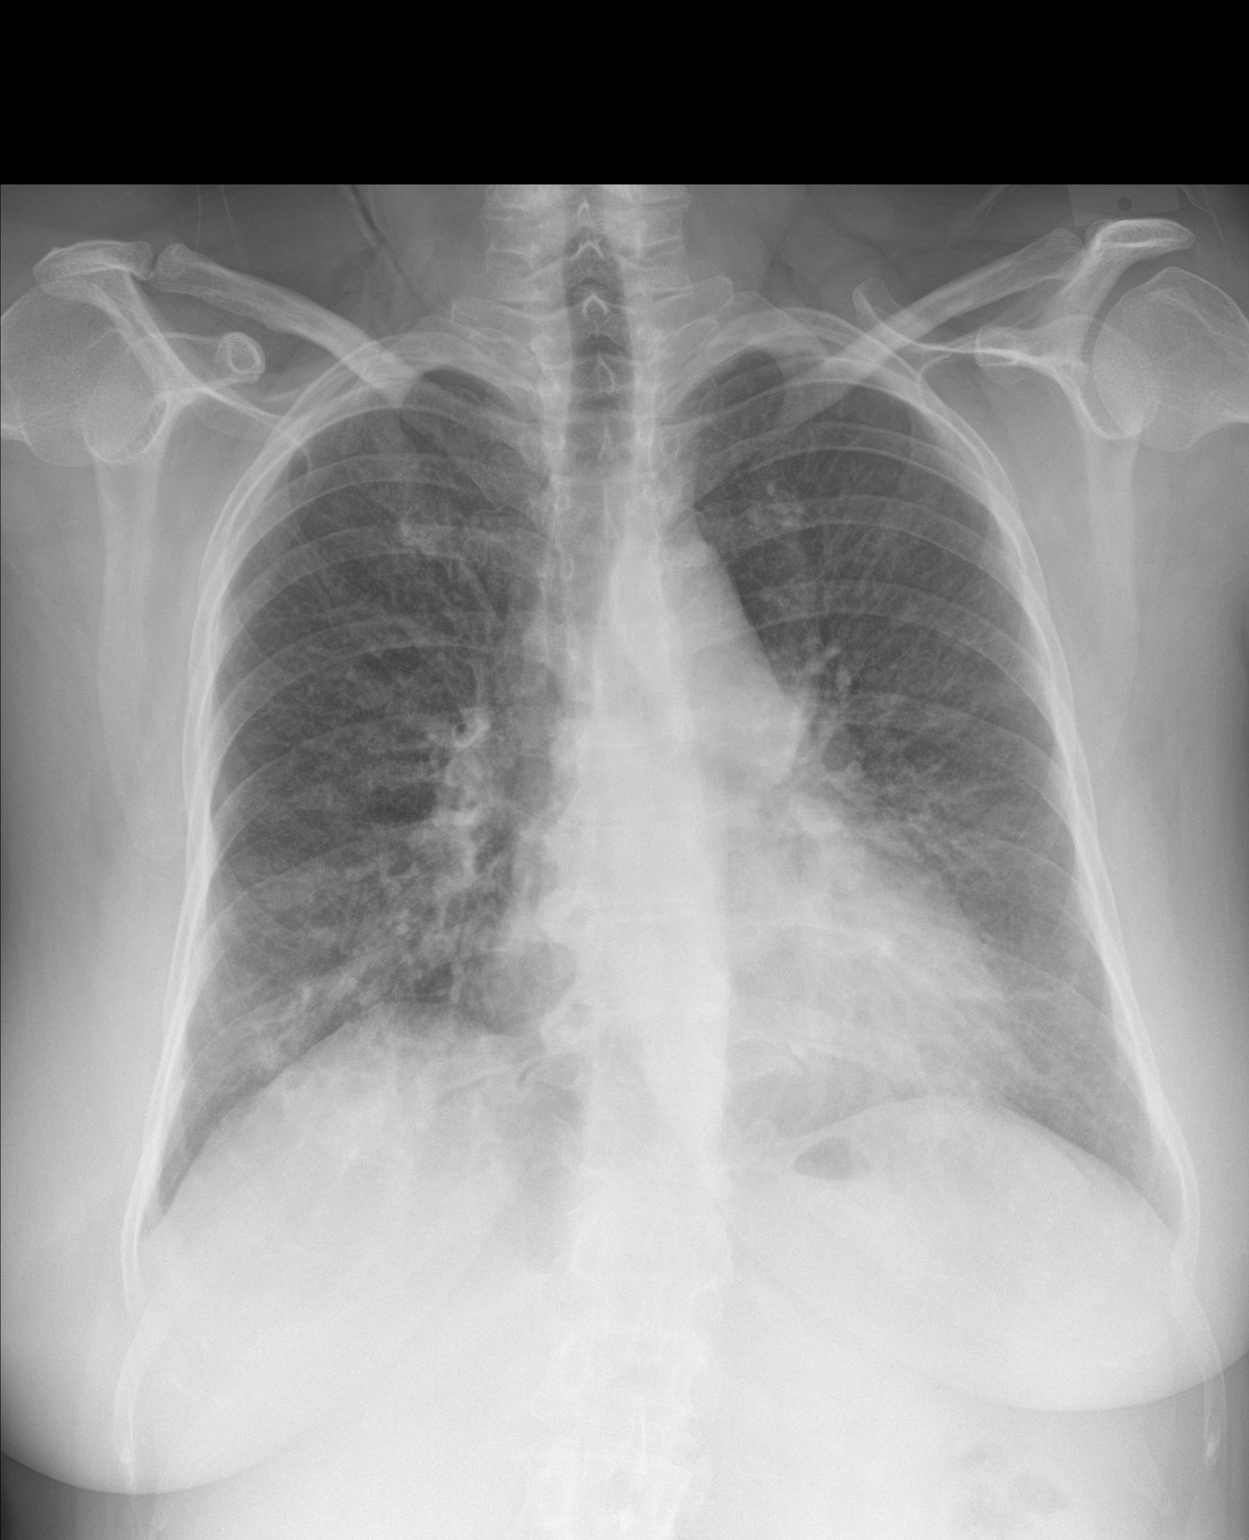

[chest lat]
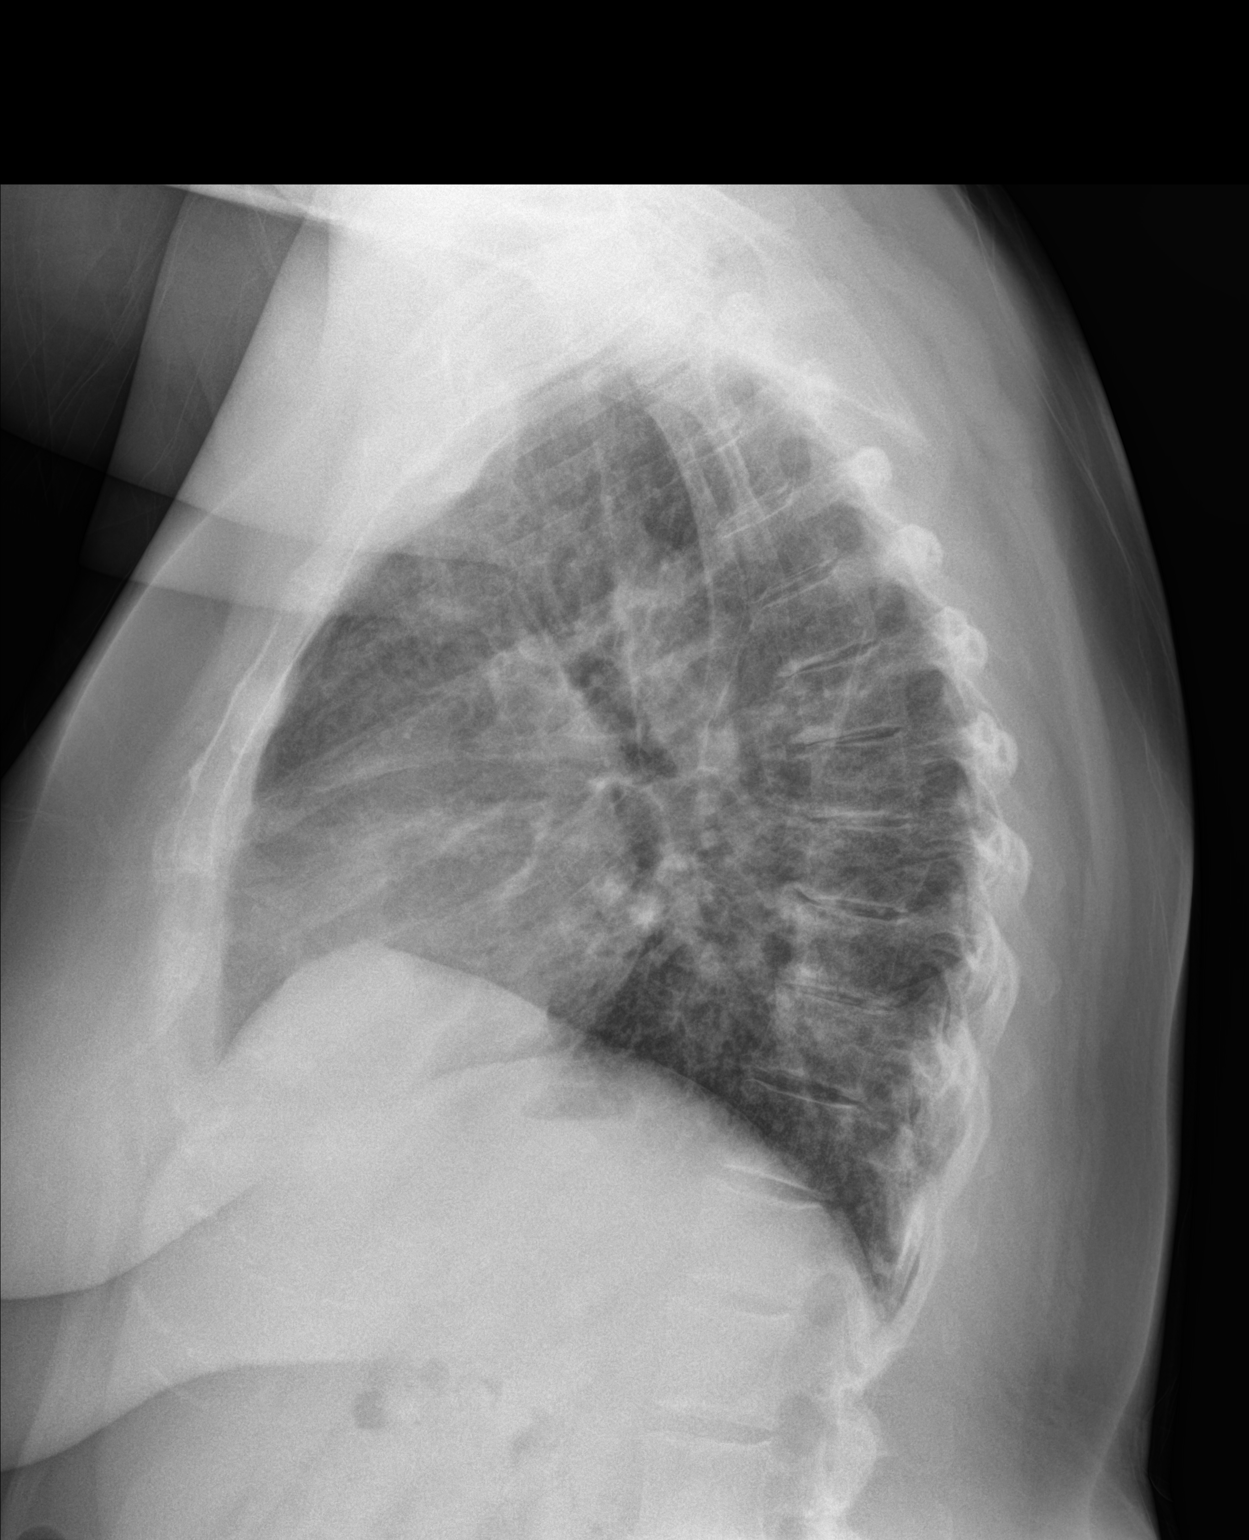

[2 of 2 positions shown; findings below may reference images not displayed]

FINDINGS: Patchy perihilar and bibasilar bronchovascular opacities concerning
for bilateral pneumonia. Normal heart size and vascularity. No
effusion or pneumothorax. Trachea is midline. No pneumothorax. Aorta
atherosclerotic. Degenerative changes noted of the spine.
IMPRESSION: Patchy perihilar and bibasilar airspace process compatible with
pneumonia, worse on the right.

## 2019-08-16 ENCOUNTER — Ambulatory Visit (INDEPENDENT_AMBULATORY_CARE_PROVIDER_SITE_OTHER): Payer: Self-pay | Admitting: Family Medicine

## 2019-08-16 ENCOUNTER — Encounter: Payer: Self-pay | Admitting: Family Medicine

## 2019-08-16 ENCOUNTER — Other Ambulatory Visit: Payer: Self-pay

## 2019-08-16 VITALS — BP 191/93 | Temp 98.5°F | Resp 20 | Ht 61.0 in | Wt 191.0 lb

## 2019-08-16 DIAGNOSIS — M5416 Radiculopathy, lumbar region: Secondary | ICD-10-CM

## 2019-08-16 MED ORDER — GABAPENTIN 300 MG PO CAPS: 300.0000 mg | ORAL_CAPSULE | Freq: Every day | ORAL | 1 refills | Status: DC

## 2019-08-16 MED ORDER — BETAMETHASONE SOD PHOS & ACET 6 (3-3) MG/ML IJ SUSP
6.0000 mg | Freq: Once | INTRAMUSCULAR | Status: AC
  Administered 2019-08-16: 6 mg via INTRAMUSCULAR

## 2019-08-16 NOTE — Patient Instructions (Signed)

## 2019-08-16 NOTE — Progress Notes (Signed)
Subjective:  Patient ID: Leo Rod, female    DOB: Jan 04, 1966  Age: 54 y.o. MRN: [id number]  CC: Back Pain   HPI JESSCIA IMM presents for 5 days of low back pain pointing to the sacroiliac region.  Radiates laterally across to the hip and down the posterior lateral aspect of the thigh and calf to the foot.  She reports tightening in the calf and tingling in the toes and top of her foot.  Onset of the acute phase was 5 days ago but this has been chronic for many years.  Currently her pain is 7-8/10.  However with activities will flareup.  10/10.  She has never seen a specialist and questions whether she needs to do that.  Patient uses hydrocodone for her knee pain this is prescribed by Dr. Hilda Lias.  She wonders if she can use it for her back.  I told her she have to continue that with Dr. Hilda Lias.  Gabapentin  Depression screen [facility] 2/9 08/16/2019 06/06/2018 01/28/2018  Decreased Interest 0 0 0  Down, Depressed, Hopeless 0 0 0  PHQ - 2 Score 0 0 0  Altered sleeping - 0 -  Tired, decreased energy - 0 -  Change in appetite - 0 -  Feeling bad or failure about yourself  - 0 -  Trouble concentrating - 0 -  Moving slowly or fidgety/restless - 0 -  Suicidal thoughts - 0 -  PHQ-9 Score - 0 -    History Clorissa has a past medical history of Anxiety, Arthritis, and Hypertension.   She has a past surgical history that includes arm surgery.   Her family history includes Hypertension in her mother.She reports that she has been smoking cigarettes. She has been smoking about 1.00 pack per day. She has never used smokeless tobacco. She reports current alcohol use. She reports that she does not use drugs.    ROS Review of Systems Negative except as per HPI Objective:  BP (!) 191/93    Temp 98.5 F (36.9 C) (Temporal)    Resp 20    Ht 5\' 1"  (1.549 m)    Wt 191 lb (86.6 kg)    SpO2 95%    BMI 36.09 kg/m   BP Readings from Last 3 Encounters:  08/16/19 (!) 191/93  06/06/18 (!) 190/103   01/28/18 (!) 169/86    Wt Readings from Last 3 Encounters:  08/16/19 191 lb (86.6 kg)  06/06/18 180 lb (81.6 kg)  01/28/18 181 lb (82.1 kg)     Physical Exam Constitutional:      General: She is not in acute distress.    Appearance: She is well-developed.  HENT:     Head: Normocephalic and atraumatic.  Eyes:     Conjunctiva/sclera: Conjunctivae normal.     Pupils: Pupils are equal, round, and reactive to light.  Cardiovascular:     Rate and Rhythm: Normal rate and regular rhythm.     Heart sounds: Normal heart sounds. No murmur heard.   Pulmonary:     Effort: Pulmonary effort is normal. No respiratory distress.     Breath sounds: Normal breath sounds. No wheezing or rales.  Abdominal:     General: Bowel sounds are normal. There is no distension.     Palpations: Abdomen is soft.     Tenderness: There is no abdominal tenderness.  Musculoskeletal:        General: Tenderness present.     Cervical back: Normal range of motion and neck  supple.     Lumbar back: Spasms and tenderness present. No deformity. Decreased range of motion.  Skin:    General: Skin is warm and dry.  Neurological:     Mental Status: She is alert and oriented to person, place, and time.     Deep Tendon Reflexes: Reflexes are normal and symmetric.  Psychiatric:        Behavior: Behavior normal.        Thought Content: Thought content normal.       Assessment & Plan:   Scherry was seen today for back pain.  Diagnoses and all orders for this visit:  Lumbar radiculopathy, right -     betamethasone acetate-betamethasone sodium phosphate (CELESTONE) injection 6 mg  Other orders -     gabapentin (NEURONTIN) 300 MG capsule; Take 1 capsule (300 mg total) by mouth at bedtime.       I have discontinued Adalyn P. Lasser's azithromycin and predniSONE. I am also having her start on gabapentin. Additionally, I am having her maintain her lisinopril, albuterol, HYDROcodone-acetaminophen, and ibuprofen. We  will continue to administer betamethasone acetate-betamethasone sodium phosphate.  Allergies as of 08/16/2019      Reactions   Penicillins Rash   Has patient had a PCN reaction causing immediate rash, facial/tongue/throat swelling, SOB or lightheadedness with hypotension: Yesyes Has patient had a PCN reaction causing severe rash involving mucus membranes or skin necrosis: No Has patient had a PCN reaction that required hospitalization No Has patient had a PCN reaction occurring within the last 10 years: No If all of the above answers are "NO", then may proceed with Cephalosporin use.      Medication List       Accurate as of August 16, 2019  8:39 AM. If you have any questions, ask your nurse or doctor.        STOP taking these medications   azithromycin 250 MG tablet Commonly known as: ZITHROMAX Stopped by: Mechele Claude, MD   predniSONE 20 MG tablet Commonly known as: DELTASONE Stopped by: Mechele Claude, MD     TAKE these medications   albuterol 108 (90 Base) MCG/ACT inhaler Commonly known as: VENTOLIN HFA Inhale 2 puffs into the lungs every 6 (six) hours as needed for wheezing or shortness of breath.   gabapentin 300 MG capsule Commonly known as: NEURONTIN Take 1 capsule (300 mg total) by mouth at bedtime. Started by: Mechele Claude, MD   HYDROcodone-acetaminophen 5-325 MG tablet Commonly known as: NORCO/VICODIN One tablet every six hours for pain.  Limit 7 days.   ibuprofen 800 MG tablet Commonly known as: ADVIL Take 800 mg by mouth every 8 (eight) hours as needed.   lisinopril 10 MG tablet Commonly known as: ZESTRIL Take 1-2 tablets (10-20 mg total) by mouth daily.       Back exercises instruction sheet given.  Encouraged to perform them twice daily both for healing and maintenance.  Keep activities minimal specifically regarding lifting for the next 2 to 4 weeks.  Patient has first visit with Dr. Nadine Counts her new primary coming up in about a month.  She can  speak to Dr. Nadine Counts then about need to titrate the gabapentin based on her response to the starter prescription given today. Follow-up: Return if symptoms worsen or fail to improve.  Mechele Claude, M.D.

## 2019-08-30 ENCOUNTER — Other Ambulatory Visit: Payer: Self-pay | Admitting: Orthopaedic Surgery

## 2019-08-30 NOTE — Telephone Encounter (Signed)
Wrong doctor °

## 2019-08-31 MED ORDER — HYDROCODONE-ACETAMINOPHEN 5-325 MG PO TABS: ORAL_TABLET | ORAL | 0 refills | Status: DC

## 2019-09-14 ENCOUNTER — Encounter: Payer: Self-pay | Admitting: Orthopaedic Surgery

## 2019-09-14 ENCOUNTER — Other Ambulatory Visit: Payer: Self-pay

## 2019-09-14 ENCOUNTER — Ambulatory Visit: Payer: Self-pay | Admitting: Orthopaedic Surgery

## 2019-09-14 VITALS — BP 212/114 | HR 97 | Ht 61.0 in | Wt 189.0 lb

## 2019-09-14 DIAGNOSIS — M25562 Pain in left knee: Secondary | ICD-10-CM

## 2019-09-14 DIAGNOSIS — F17200 Nicotine dependence, unspecified, uncomplicated: Secondary | ICD-10-CM

## 2019-09-14 DIAGNOSIS — G8929 Other chronic pain: Secondary | ICD-10-CM

## 2019-09-14 NOTE — Progress Notes (Signed)
PROCEDURE NOTE:  The patient request injection, verbal consent was obtained.  The left knee was prepped appropriately after time out was performed.   Sterile technique was observed and anesthesia was provided by ethyl chloride and a 20-gauge needle was used to inject the knee area.  A 16-gauge needle was then used to aspirate the knee.  Color of fluid aspirated was straw  Total cc's aspirated was 40.    Injection of 1 cc of Depo-Medrol 40 mg with several cc's of plain xylocaine was then performed.  A band aid dressing was applied.  The patient was advised to apply ice later today and tomorrow to the injection sight as needed.  See as needed.  Call if any problem.  Precautions discussed.   Electronically Signed Darreld Mclean, MD 8/19/202111:04 AM

## 2019-09-19 ENCOUNTER — Ambulatory Visit: Payer: Self-pay | Admitting: Family Medicine

## 2019-09-27 ENCOUNTER — Other Ambulatory Visit: Payer: Self-pay | Admitting: Orthopaedic Surgery

## 2019-09-27 MED ORDER — HYDROCODONE-ACETAMINOPHEN 5-325 MG PO TABS: ORAL_TABLET | ORAL | 0 refills | Status: DC

## 2019-10-31 ENCOUNTER — Other Ambulatory Visit: Payer: Self-pay | Admitting: Orthopaedic Surgery

## 2019-10-31 MED ORDER — HYDROCODONE-ACETAMINOPHEN 5-325 MG PO TABS: ORAL_TABLET | ORAL | 0 refills | Status: DC

## 2019-11-29 ENCOUNTER — Other Ambulatory Visit: Payer: Self-pay | Admitting: Orthopaedic Surgery

## 2019-11-29 MED ORDER — HYDROCODONE-ACETAMINOPHEN 5-325 MG PO TABS: ORAL_TABLET | ORAL | 0 refills | Status: DC

## 2019-12-27 ENCOUNTER — Other Ambulatory Visit: Payer: Self-pay | Admitting: Orthopaedic Surgery

## 2019-12-27 MED ORDER — HYDROCODONE-ACETAMINOPHEN 5-325 MG PO TABS: ORAL_TABLET | ORAL | 0 refills | Status: DC

## 2020-01-30 ENCOUNTER — Telehealth: Payer: Self-pay | Admitting: Orthopaedic Surgery

## 2020-01-30 MED ORDER — HYDROCODONE-ACETAMINOPHEN 5-325 MG PO TABS: ORAL_TABLET | ORAL | 0 refills | Status: DC

## 2020-02-28 ENCOUNTER — Telehealth: Payer: Self-pay | Admitting: Orthopaedic Surgery

## 2020-02-28 MED ORDER — HYDROCODONE-ACETAMINOPHEN 5-325 MG PO TABS: ORAL_TABLET | ORAL | 0 refills | Status: DC

## 2020-03-26 ENCOUNTER — Telehealth: Payer: Self-pay | Admitting: Orthopaedic Surgery

## 2020-03-26 MED ORDER — HYDROCODONE-ACETAMINOPHEN 5-325 MG PO TABS: ORAL_TABLET | ORAL | 0 refills | Status: DC

## 2020-04-30 ENCOUNTER — Telehealth: Payer: Self-pay | Admitting: Orthopaedic Surgery

## 2020-04-30 MED ORDER — HYDROCODONE-ACETAMINOPHEN 5-325 MG PO TABS: ORAL_TABLET | ORAL | 0 refills | Status: DC

## 2020-05-09 DIAGNOSIS — Z720 Tobacco use: Secondary | ICD-10-CM | POA: Insufficient documentation

## 2020-05-09 DIAGNOSIS — E669 Obesity, unspecified: Secondary | ICD-10-CM | POA: Insufficient documentation

## 2020-05-09 DIAGNOSIS — R7989 Other specified abnormal findings of blood chemistry: Secondary | ICD-10-CM | POA: Insufficient documentation

## 2020-05-28 ENCOUNTER — Telehealth: Payer: Self-pay | Admitting: Orthopaedic Surgery

## 2020-05-28 MED ORDER — HYDROCODONE-ACETAMINOPHEN 5-325 MG PO TABS: ORAL_TABLET | ORAL | 0 refills | Status: DC

## 2020-06-27 ENCOUNTER — Telehealth: Payer: Self-pay | Admitting: Orthopaedic Surgery

## 2020-06-27 MED ORDER — HYDROCODONE-ACETAMINOPHEN 5-325 MG PO TABS: ORAL_TABLET | ORAL | 0 refills | Status: DC

## 2020-07-17 ENCOUNTER — Other Ambulatory Visit: Payer: Self-pay | Admitting: Family Medicine

## 2020-07-25 ENCOUNTER — Telehealth: Payer: Self-pay | Admitting: Orthopaedic Surgery

## 2020-07-25 MED ORDER — HYDROCODONE-ACETAMINOPHEN 5-325 MG PO TABS: ORAL_TABLET | ORAL | 0 refills | Status: DC

## 2020-08-15 ENCOUNTER — Telehealth: Payer: Self-pay | Admitting: Orthopaedic Surgery

## 2020-08-15 MED ORDER — HYDROCODONE-ACETAMINOPHEN 5-325 MG PO TABS: ORAL_TABLET | ORAL | 0 refills | Status: DC

## 2020-08-27 ENCOUNTER — Encounter: Payer: Self-pay | Admitting: Orthopaedic Surgery

## 2020-08-27 ENCOUNTER — Ambulatory Visit (INDEPENDENT_AMBULATORY_CARE_PROVIDER_SITE_OTHER): Payer: Self-pay | Admitting: Orthopaedic Surgery

## 2020-08-27 ENCOUNTER — Other Ambulatory Visit: Payer: Self-pay

## 2020-08-27 VITALS — BP 211/122 | HR 106 | Ht 62.0 in | Wt 214.6 lb

## 2020-08-27 DIAGNOSIS — M25561 Pain in right knee: Secondary | ICD-10-CM

## 2020-08-27 DIAGNOSIS — M25562 Pain in left knee: Secondary | ICD-10-CM

## 2020-08-27 DIAGNOSIS — G8929 Other chronic pain: Secondary | ICD-10-CM

## 2020-08-27 NOTE — Progress Notes (Signed)
PROCEDURE NOTE:  The patient requests injections of the right knee , verbal consent was obtained.  The right knee was prepped appropriately after time out was performed.   Sterile technique was observed and injection of 1 cc of Celestone 6mg  with several cc's of plain xylocaine. Anesthesia was provided by ethyl chloride and a 20-gauge needle was used to inject the knee area. The injection was tolerated well.  A band aid dressing was applied.  The patient was advised to apply ice later today and tomorrow to the injection sight as needed.   PROCEDURE NOTE:  The patient requests injections of the left knee , verbal consent was obtained.  The left knee was prepped appropriately after time out was performed.   Sterile technique was observed and injection of 1 cc of Celestone 6 mg with several cc's of plain xylocaine. Anesthesia was provided by ethyl chloride and a 20-gauge needle was used to inject the knee area. The injection was tolerated well.  A band aid dressing was applied.  The patient was advised to apply ice later today and tomorrow to the injection sight as needed.   Return in one month.  She may need X-rays of the right knee if not improved.  Call if any problem.  Precautions discussed.  Electronically Signed , MD 8/2/20229:15 AM

## 2020-08-29 ENCOUNTER — Ambulatory Visit (INDEPENDENT_AMBULATORY_CARE_PROVIDER_SITE_OTHER): Payer: Self-pay | Admitting: Family Medicine

## 2020-08-29 ENCOUNTER — Encounter: Payer: Self-pay | Admitting: Family Medicine

## 2020-08-29 DIAGNOSIS — U071 COVID-19: Secondary | ICD-10-CM

## 2020-08-29 MED ORDER — AZITHROMYCIN 250 MG PO TABS: ORAL_TABLET | ORAL | 0 refills | Status: DC

## 2020-08-29 MED ORDER — MOLNUPIRAVIR EUA 200MG CAPSULE: 4.0000 | ORAL_CAPSULE | Freq: Two times a day (BID) | ORAL | 0 refills | Status: AC

## 2020-08-29 NOTE — Progress Notes (Signed)
   Virtual Visit  Note Due to COVID-19 pandemic this visit was conducted virtually. This visit type was conducted due to national recommendations for restrictions regarding the COVID-19 Pandemic (e.g. social distancing, sheltering in place) in an effort to limit this patient's exposure and mitigate transmission in our community. All issues noted in this document were discussed and addressed.  A physical exam was not performed with this format.  I connected with Danielle [name] on 08/29/20 at 1300 by telephone and verified that I am speaking with the correct person using two identifiers. Danielle [name] is currently located at home and no one is currently with her during the visit. The provider, Gabriel Earing, FNP is located in their office at time of visit.  I discussed the limitations, risks, security and privacy concerns of performing an evaluation and management service by telephone and the availability of in person appointments. I also discussed with the patient that there may be a patient responsible charge related to this service. The patient expressed understanding and agreed to proceed.  CC: Covid  History and Present Illness:  HPI Danielle [name] reports testing positive from Covid this morning with a home test. She had a known exposure at work. She reports that her symptoms started this morning. She has a sore throat, headache, dry cough, body aches, and wheezing. She denies fever, shortness of breath, chest pain, nausea, vomiting, or diarrhea. She has a pulse ox monitor at home and reports it has be staying above 95%. She has been taking advil and using her albuterol inhaler prn. She has a history of reactive airway disease and is a smoker. She was hospitalized earlier this year for hypoxia due to pneumonia.     ROS As per HPI.   Observations/Objective: Alert and oriented x 3. Able to speak in full sentences without difficulty.   Assessment and Plan: Danielle [name] was seen today for covid  positive.  Diagnoses and all orders for this visit:  COVID-19 Molnupiravir ordered. Zpak also ordered. Mucinex for congestion, cough. Tylenol for fever pain. Albuterol prn. Rest, hydration. Discussed return precautions and when to seek emergency care.  -     molnupiravir EUA 200 mg CAPS; Take 4 capsules (800 mg total) by mouth 2 (two) times daily for 5 days. -     azithromycin (ZITHROMAX Z-PAK) 250 MG tablet; As directed    Follow Up Instructions: Return to office for new or worsening symptoms, or if symptoms persist.     I discussed the assessment and treatment plan with the patient. The patient was provided an opportunity to ask questions and all were answered. The patient agreed with the plan and demonstrated an understanding of the instructions.   The patient was advised to call back or seek an in-person evaluation if the symptoms worsen or if the condition fails to improve as anticipated.  The above assessment and management plan was discussed with the patient. The patient verbalized understanding of and has agreed to the management plan. Patient is aware to call the clinic if symptoms persist or worsen. Patient is aware when to return to the clinic for a follow-up visit. Patient educated on when it is appropriate to go to the emergency department.   Time call ended:  1313  I provided 13 minutes of  non face-to-face time during this encounter.    Gabriel Earing, FNP

## 2020-09-12 ENCOUNTER — Other Ambulatory Visit: Payer: Self-pay | Admitting: Orthopaedic Surgery

## 2020-09-18 ENCOUNTER — Other Ambulatory Visit: Payer: Self-pay | Admitting: Orthopaedic Surgery

## 2020-09-19 MED ORDER — HYDROCODONE-ACETAMINOPHEN 5-325 MG PO TABS: ORAL_TABLET | ORAL | 0 refills | Status: DC

## 2020-09-26 ENCOUNTER — Ambulatory Visit: Payer: Self-pay | Admitting: Orthopaedic Surgery

## 2020-10-03 ENCOUNTER — Ambulatory Visit: Payer: Self-pay | Admitting: Orthopaedic Surgery

## 2020-10-08 ENCOUNTER — Ambulatory Visit (INDEPENDENT_AMBULATORY_CARE_PROVIDER_SITE_OTHER): Payer: Self-pay | Admitting: Orthopaedic Surgery

## 2020-10-08 ENCOUNTER — Other Ambulatory Visit: Payer: Self-pay

## 2020-10-08 ENCOUNTER — Encounter: Payer: Self-pay | Admitting: Orthopaedic Surgery

## 2020-10-08 DIAGNOSIS — G8929 Other chronic pain: Secondary | ICD-10-CM

## 2020-10-08 DIAGNOSIS — M25562 Pain in left knee: Secondary | ICD-10-CM

## 2020-10-08 DIAGNOSIS — F17200 Nicotine dependence, unspecified, uncomplicated: Secondary | ICD-10-CM

## 2020-10-08 NOTE — Patient Instructions (Signed)
Steps to Quit Smoking Smoking tobacco is the leading cause of preventable death. It can affect almost every organ in the body. Smoking puts you and people around you at risk for many serious, long-lasting (chronic) diseases. Quitting smoking can be hard, but it is one of the best things that you can do for your health. It is never too late to quit. How do I get ready to quit? When you decide to quit smoking, make a plan to help you succeed. Before you quit: Pick a date to quit. Set a date within the next 2 weeks to give you time to prepare. Write down the reasons why you are quitting. Keep this list in places where you will see it often. Tell your family, friends, and co-workers that you are quitting. Their support is important. Talk with your doctor about the choices that may help you quit. Find out if your health insurance will pay for these treatments. Know the people, places, things, and activities that make you want to smoke (triggers). Avoid them. What first steps can I take to quit smoking? Throw away all cigarettes at home, at work, and in your car. Throw away the things that you use when you smoke, such as ashtrays and lighters. Clean your car. Make sure to empty the ashtray. Clean your home, including curtains and carpets. What can I do to help me quit smoking? Talk with your doctor about taking medicines and seeing a counselor at the same time. You are more likely to succeed when you do both. If you are pregnant or breastfeeding, talk with your doctor about counseling or other ways to quit smoking. Do not take medicine to help you quit smoking unless your doctor tells you to do so. To quit smoking: Quit right away Quit smoking totally, instead of slowly cutting back on how much you smoke over a period of time. Go to counseling. You are more likely to quit if you go to counseling sessions regularly. Take medicine You may take medicines to help you quit. Some medicines need a  prescription, and some you can buy over-the-counter. Some medicines may contain a drug called nicotine to replace the nicotine in cigarettes. Medicines may: Help you to stop having the desire to smoke (cravings). Help to stop the problems that come when you stop smoking (withdrawal symptoms). Your doctor may ask you to use: Nicotine patches, gum, or lozenges. Nicotine inhalers or sprays. Non-nicotine medicine that is taken by mouth. Find resources Find resources and other ways to help you quit smoking and remain smoke-free after you quit. These resources are most helpful when you use them often. They include: Online chats with a counselor. Phone quitlines. Printed self-help materials. Support groups or group counseling. Text messaging programs. Mobile phone apps. Use apps on your mobile phone or tablet that can help you stick to your quit plan. There are many free apps for mobile phones and tablets as well as websites. Examples include Quit Guide from the CDC and smokefree.gov  What things can I do to make it easier to quit?  Talk to your family and friends. Ask them to support and encourage you. Call a phone quitline (1-800-QUIT-NOW), reach out to support groups, or work with a counselor. Ask people who smoke to not smoke around you. Avoid places that make you want to smoke, such as: Bars. Parties. Smoke-break areas at work. Spend time with people who do not smoke. Lower the stress in your life. Stress can make you want to   smoke. Try these things to help your stress: Getting regular exercise. Doing deep-breathing exercises. Doing yoga. Meditating. Doing a body scan. To do this, close your eyes, focus on one area of your body at a time from head to toe. Notice which parts of your body are tense. Try to relax the muscles in those areas. How will I feel when I quit smoking? Day 1 to 3 weeks Within the first 24 hours, you may start to have some problems that come from quitting tobacco.  These problems are very bad 2-3 days after you quit, but they do not often last for more than 2-3 weeks. You may get these symptoms: Mood swings. Feeling restless, nervous, angry, or annoyed. Trouble concentrating. Dizziness. Strong desire for high-sugar foods and nicotine. Weight gain. Trouble pooping (constipation). Feeling like you may vomit (nausea). Coughing or a sore throat. Changes in how the medicines that you take for other issues work in your body. Depression. Trouble sleeping (insomnia). Week 3 and afterward After the first 2-3 weeks of quitting, you may start to notice more positive results, such as: Better sense of smell and taste. Less coughing and sore throat. Slower heart rate. Lower blood pressure. Clearer skin. Better breathing. Fewer sick days. Quitting smoking can be hard. Do not give up if you fail the first time. Some people need to try a few times before they succeed. Do your best to stick to your quit plan, and talk with your doctor if you have any questions or concerns. Summary Smoking tobacco is the leading cause of preventable death. Quitting smoking can be hard, but it is one of the best things that you can do for your health. When you decide to quit smoking, make a plan to help you succeed. Quit smoking right away, not slowly over a period of time. When you start quitting, seek help from your doctor, family, or friends. This information is not intended to replace advice given to you by your health care provider. Make sure you discuss any questions you have with your health care provider. Document Revised: 10/07/2018 Document Reviewed: 04/02/2018 Elsevier Patient Education  2022 [employer]  

## 2020-10-08 NOTE — Progress Notes (Signed)
PROCEDURE NOTE:  The patient requests injections of the left knee , verbal consent was obtained.  The left knee was prepped appropriately after time out was performed.   Sterile technique was observed and injection of 1 cc of Celestone 6 mg with several cc's of plain xylocaine. Anesthesia was provided by ethyl chloride and a 20-gauge needle was used to inject the knee area. The injection was tolerated well.  A band aid dressing was applied.  The patient was advised to apply ice later today and tomorrow to the injection sight as needed.   Return in six weeks.  Encounter Diagnoses  Name Primary?   Chronic pain of left knee Yes   Tobacco smoker within last 12 months    Call if any problem.  Precautions discussed.  Electronically Signed Darreld Mclean, MD 9/13/20229:54 AM

## 2020-10-16 ENCOUNTER — Ambulatory Visit (INDEPENDENT_AMBULATORY_CARE_PROVIDER_SITE_OTHER): Payer: Self-pay | Admitting: Family Medicine

## 2020-10-16 ENCOUNTER — Encounter: Payer: Self-pay | Admitting: Family Medicine

## 2020-10-16 DIAGNOSIS — J988 Other specified respiratory disorders: Secondary | ICD-10-CM

## 2020-10-16 DIAGNOSIS — R062 Wheezing: Secondary | ICD-10-CM

## 2020-10-16 DIAGNOSIS — Z720 Tobacco use: Secondary | ICD-10-CM

## 2020-10-16 DIAGNOSIS — I10 Essential (primary) hypertension: Secondary | ICD-10-CM

## 2020-10-16 MED ORDER — ALBUTEROL SULFATE HFA 108 (90 BASE) MCG/ACT IN AERS: 2.0000 | INHALATION_SPRAY | Freq: Four times a day (QID) | RESPIRATORY_TRACT | 0 refills | Status: DC | PRN

## 2020-10-16 MED ORDER — LISINOPRIL 10 MG PO TABS: 10.0000 mg | ORAL_TABLET | Freq: Every day | ORAL | 0 refills | Status: DC

## 2020-10-16 MED ORDER — DOXYCYCLINE HYCLATE 100 MG PO TABS: 100.0000 mg | ORAL_TABLET | Freq: Two times a day (BID) | ORAL | 0 refills | Status: AC

## 2020-10-16 MED ORDER — PREDNISONE 20 MG PO TABS: 40.0000 mg | ORAL_TABLET | Freq: Every day | ORAL | 0 refills | Status: AC

## 2020-10-16 NOTE — Progress Notes (Signed)
Virtual Visit  Note Due to COVID-19 pandemic this visit was conducted virtually. This visit type was conducted due to national recommendations for restrictions regarding the COVID-19 Pandemic (e.g. social distancing, sheltering in place) in an effort to limit this patient's exposure and mitigate transmission in our community. All issues noted in this document were discussed and addressed.  A physical exam was not performed with this format.  I connected with Danielle [name] on 10/16/20 at 1425 by telephone and verified that I am speaking with the correct person using two identifiers. Danielle [name] is currently located at home and no one is currently with her during visit. The provider, Gabriel Earing, FNP is located in their office at time of visit.  I discussed the limitations, risks, security and privacy concerns of performing an evaluation and management service by telephone and the availability of in person appointments. I also discussed with the patient that there may be a patient responsible charge related to this service. The patient expressed understanding and agreed to proceed.  CC: cough  History and Present Illness:  HPI Danielle [name] reports cough x 6 days. Her cough is now productive with yellow sputum. She has some shortness of breath with activity at baseline and she reports that this has worsened as well. She also reports HA, head congestion, and intermittent wheezing. She has a history of previous pneumonia infections requiring hospitalization. She has been monitoring her 02 saturation at home and has been getting readings of 94-95%. She denies chest pain, fever, body aches, or chills. She has been using her albuterol inhaler every 4-6 hours and taking mucinex and advil. She reports that her symptoms have been worsening since they started. She is a current every day smoker. She has not been officially diagnosed with COPD, but when she was in the hospital a few months ago for respiratory  failure she was told that she likely has COPD. She also had Covid about 6 weeks ago. She was treated with oral antivirals and a zpak. She also needs a refill on her lisinopril today. She is currently out.     ROS As per HPI.    Observations/Objective: Alert and oriented x 3. Able to speak in full sentences without difficulty.   Assessment and Plan: Danielle [name] was seen today for cough.  Diagnoses and all orders for this visit:  Respiratory infection ? COPD, current everyday smoker. Doxycycline and prednisone ordered as below. Continue albuterol prn. Strict return precautions given and also discussed when to seek emergency care. Continue mucinex as needed.  -     albuterol (VENTOLIN HFA) 108 (90 Base) MCG/ACT inhaler; Inhale 2 puffs into the lungs every 6 (six) hours as needed for wheezing or shortness of breath. -     doxycycline (VIBRA-TABS) 100 MG tablet; Take 1 tablet (100 mg total) by mouth 2 (two) times daily for 7 days. 1 po bid -     predniSONE (DELTASONE) 20 MG tablet; Take 2 tablets (40 mg total) by mouth daily with breakfast for 5 days.  Tobacco abuse  Wheezing -     albuterol (VENTOLIN HFA) 108 (90 Base) MCG/ACT inhaler; Inhale 2 puffs into the lungs every 6 (six) hours as needed for wheezing or shortness of breath.  Primary hypertension Refill provided today. Patient will need an appointment with PCP for further refills.  -     lisinopril (ZESTRIL) 10 MG tablet; Take 1 tablet (10 mg total) by mouth daily.    Follow Up Instructions: Return  to office for new or worsening symptoms, or if symptoms persist.     I discussed the assessment and treatment plan with the patient. The patient was provided an opportunity to ask questions and all were answered. The patient agreed with the plan and demonstrated an understanding of the instructions.   The patient was advised to call back or seek an in-person evaluation if the symptoms worsen or if the condition fails to improve as  anticipated.  The above assessment and management plan was discussed with the patient. The patient verbalized understanding of and has agreed to the management plan. Patient is aware to call the clinic if symptoms persist or worsen. Patient is aware when to return to the clinic for a follow-up visit. Patient educated on when it is appropriate to go to the emergency department.   Time call ended:  1440  I provided 15 minutes of  non face-to-face time during this encounter.    Gabriel Earing, FNP

## 2020-10-22 ENCOUNTER — Telehealth: Payer: Self-pay

## 2020-10-22 MED ORDER — HYDROCODONE-ACETAMINOPHEN 5-325 MG PO TABS: ORAL_TABLET | ORAL | 0 refills | Status: DC

## 2020-10-24 ENCOUNTER — Telehealth: Payer: Self-pay | Admitting: Family Medicine

## 2020-10-24 NOTE — Telephone Encounter (Signed)
Pt scheduled with Je tomorrow for televisit.

## 2020-10-24 NOTE — Telephone Encounter (Signed)
  Prescription Request  10/24/2020  Is this a "Controlled Substance" medicine? no Have you seen your PCP in the last 2 weeks? 10/16/20 If YES, route message to pool  -  If NO, patient needs to be scheduled for appointment.  What is the name of the medication or equipment? Pt needs another rx for prednizone. She still has congestion   Have you contacted your pharmacy to request a refill?   Which pharmacy would you like this sent to? [facility]   Patient notified that their request is being sent to the clinical staff for review and that they should receive a response within 2 business days.

## 2020-10-24 NOTE — Telephone Encounter (Signed)
Patient seen Tiffany - please advise  

## 2020-10-24 NOTE — Telephone Encounter (Signed)
Prednisone can only be used short term. If she is not feeling better, she should be evaluated.

## 2020-10-25 ENCOUNTER — Ambulatory Visit (INDEPENDENT_AMBULATORY_CARE_PROVIDER_SITE_OTHER): Payer: Self-pay | Admitting: Nurse Practitioner

## 2020-10-25 ENCOUNTER — Encounter: Payer: Self-pay | Admitting: Nurse Practitioner

## 2020-10-25 DIAGNOSIS — J069 Acute upper respiratory infection, unspecified: Secondary | ICD-10-CM

## 2020-10-25 MED ORDER — AZITHROMYCIN 250 MG PO TABS: ORAL_TABLET | ORAL | 0 refills | Status: AC

## 2020-10-25 MED ORDER — PREDNISONE 10 MG (21) PO TBPK: ORAL_TABLET | ORAL | 0 refills | Status: DC

## 2020-10-25 NOTE — Progress Notes (Signed)
   Virtual Visit  Note Due to COVID-19 pandemic this visit was conducted virtually. This visit type was conducted due to national recommendations for restrictions regarding the COVID-19 Pandemic (e.g. social distancing, sheltering in place) in an effort to limit this patient's exposure and mitigate transmission in our community. All issues noted in this document were discussed and addressed.  A physical exam was not performed with this format.  I connected with Danielle [name] on 10/25/20 at 9:38 AM by telephone and verified that I am speaking with the correct person using two identifiers. Danielle [name] is currently located at home during visit. The provider, Daryll Drown, NP is located in their office at time of visit.  I discussed the limitations, risks, security and privacy concerns of performing an evaluation and management service by telephone and the availability of in person appointments. I also discussed with the patient that there may be a patient responsible charge related to this service. The patient expressed understanding and agreed to proceed.   History and Present Illness:  URI  This is a recurrent problem. The current episode started in the past 7 days. The problem has been gradually worsening. There has been no fever. Associated symptoms include congestion and coughing. Pertinent negatives include no rash. She has tried nothing for the symptoms.     Review of Systems  Constitutional:  Positive for fever.  HENT:  Positive for congestion.   Respiratory:  Positive for cough and shortness of breath.   Cardiovascular: Negative.   Skin:  Negative for rash.  All other systems reviewed and are negative.   Observations/Objective: Televisit patient not in distress  Assessment and Plan: Take meds as prescribed - Use a cool mist humidifier  -Use saline nose sprays frequently -Force fluids -Prednisone taper -Azithromycin 250 mg tablet by mouth, 2 tablet day 1, 1 tablet day  2-5. -For fever or aches or pains- take Tylenol or ibuprofen. -If symptoms do not improve, she may need to be COVID tested to rule this out -At home COVID test 19 negative Follow up with worsening unresolved symptoms   Follow Up Instructions: Follow-up with worsening or unresolved symptoms    I discussed the assessment and treatment plan with the patient. The patient was provided an opportunity to ask questions and all were answered. The patient agreed with the plan and demonstrated an understanding of the instructions.   The patient was advised to call back or seek an in-person evaluation if the symptoms worsen or if the condition fails to improve as anticipated.  The above assessment and management plan was discussed with the patient. The patient verbalized understanding of and has agreed to the management plan. Patient is aware to call the clinic if symptoms persist or worsen. Patient is aware when to return to the clinic for a follow-up visit. Patient educated on when it is appropriate to go to the emergency department.   Time call ended: 9:47 AM  I provided 9 minutes of  non face-to-face time during this encounter.    Daryll Drown, NP

## 2020-10-25 NOTE — Assessment & Plan Note (Signed)
Unresolved upper respiratory infection symptoms.  Questionable COPD.  Patient would wait until she is able to afford pulmonary assessment. Education provided to patient printed handouts given. Take meds as prescribed - Use a cool mist humidifier  -Use saline nose sprays frequently -Force fluids -Prednisone taper -Azithromycin 250 mg tablet by mouth, 2 tablet day 1, 1 tablet day 2-5. -For fever or aches or pains- take Tylenol or ibuprofen. -If symptoms do not improve, she may need to be COVID tested to rule this out -At home COVID test 19 negative Follow up with worsening unresolved symptoms

## 2020-11-01 NOTE — Telephone Encounter (Signed)
Done

## 2020-11-19 ENCOUNTER — Ambulatory Visit: Payer: Self-pay | Admitting: Orthopaedic Surgery

## 2020-11-21 ENCOUNTER — Other Ambulatory Visit: Payer: Self-pay | Admitting: Orthopaedic Surgery

## 2020-11-21 MED ORDER — HYDROCODONE-ACETAMINOPHEN 5-325 MG PO TABS: ORAL_TABLET | ORAL | 0 refills | Status: DC

## 2020-11-26 ENCOUNTER — Ambulatory Visit (INDEPENDENT_AMBULATORY_CARE_PROVIDER_SITE_OTHER): Payer: Self-pay | Admitting: Orthopaedic Surgery

## 2020-11-26 ENCOUNTER — Other Ambulatory Visit: Payer: Self-pay

## 2020-11-26 ENCOUNTER — Encounter: Payer: Self-pay | Admitting: Orthopaedic Surgery

## 2020-11-26 DIAGNOSIS — G8929 Other chronic pain: Secondary | ICD-10-CM

## 2020-11-26 DIAGNOSIS — M25562 Pain in left knee: Secondary | ICD-10-CM

## 2020-11-26 NOTE — Progress Notes (Signed)
PROCEDURE NOTE:  The patient requests injections of the left knee , verbal consent was obtained.  The left knee was prepped appropriately after time out was performed.   Sterile technique was observed and injection of 1 cc of DepoMedrol 40 mg with several cc's of plain xylocaine. Anesthesia was provided by ethyl chloride and a 20-gauge needle was used to inject the knee area. The injection was tolerated well.  A band aid dressing was applied.  The patient was advised to apply ice later today and tomorrow to the injection sight as needed.   Encounter Diagnosis  Name Primary?   Chronic pain of left knee Yes   Return prn  Call if any problem.  Precautions discussed.  Electronically Signed Darreld Mclean, MD 11/1/20229:18 AM

## 2020-12-25 ENCOUNTER — Telehealth: Payer: Self-pay | Admitting: Orthopedic Surgery

## 2020-12-25 ENCOUNTER — Other Ambulatory Visit: Payer: Self-pay | Admitting: Family Medicine

## 2020-12-25 DIAGNOSIS — I10 Essential (primary) hypertension: Secondary | ICD-10-CM

## 2020-12-26 MED ORDER — HYDROCODONE-ACETAMINOPHEN 5-325 MG PO TABS: ORAL_TABLET | ORAL | 0 refills | Status: DC

## 2021-01-28 ENCOUNTER — Telehealth: Payer: Self-pay | Admitting: Orthopaedic Surgery

## 2021-01-28 MED ORDER — HYDROCODONE-ACETAMINOPHEN 5-325 MG PO TABS: ORAL_TABLET | ORAL | 0 refills | Status: DC

## 2021-02-26 ENCOUNTER — Telehealth: Payer: Self-pay | Admitting: Orthopaedic Surgery

## 2021-02-26 MED ORDER — HYDROCODONE-ACETAMINOPHEN 5-325 MG PO TABS: ORAL_TABLET | ORAL | 0 refills | Status: DC

## 2021-03-26 ENCOUNTER — Other Ambulatory Visit: Payer: Self-pay | Admitting: Orthopaedic Surgery

## 2021-03-26 MED ORDER — HYDROCODONE-ACETAMINOPHEN 5-325 MG PO TABS: ORAL_TABLET | ORAL | 0 refills | Status: DC

## 2021-04-29 ENCOUNTER — Other Ambulatory Visit: Payer: Self-pay | Admitting: Orthopedic Surgery

## 2021-04-30 MED ORDER — HYDROCODONE-ACETAMINOPHEN 5-325 MG PO TABS: ORAL_TABLET | ORAL | 0 refills | Status: DC

## 2021-05-28 ENCOUNTER — Telehealth: Payer: Self-pay | Admitting: Orthopedic Surgery

## 2021-05-29 MED ORDER — HYDROCODONE-ACETAMINOPHEN 5-325 MG PO TABS: ORAL_TABLET | ORAL | 0 refills | Status: DC

## 2021-06-26 ENCOUNTER — Other Ambulatory Visit: Payer: Self-pay | Admitting: Orthopaedic Surgery

## 2021-06-26 MED ORDER — HYDROCODONE-ACETAMINOPHEN 5-325 MG PO TABS: ORAL_TABLET | ORAL | 0 refills | Status: DC

## 2021-07-31 ENCOUNTER — Telehealth: Payer: Self-pay | Admitting: Orthopedic Surgery

## 2021-08-27 ENCOUNTER — Encounter: Payer: Self-pay | Admitting: Orthopaedic Surgery

## 2021-09-02 NOTE — Telephone Encounter (Signed)
Called patient - phone number not going through (automated message)

## 2022-03-26 ENCOUNTER — Encounter: Payer: Self-pay | Admitting: Radiology

## 2022-07-13 ENCOUNTER — Telehealth: Payer: Self-pay

## 2022-07-13 NOTE — Transitions of Care (Post Inpatient/ED Visit) (Unsigned)
   07/13/2022  Name: Danielle [name] MRN: [id number] DOB: 1965-06-20  Today's TOC FU Call Status: Today's TOC FU Call Status:: Unsuccessul Call (1st Attempt) Unsuccessful Call (1st Attempt) Date: 07/13/22  Attempted to reach the patient regarding the most recent Inpatient/ED visit.  Follow Up Plan: Additional outreach attempts will be made to reach the patient to complete the Transitions of Care (Post Inpatient/ED visit) call.   Signature Karena Addison, LPN [facility] Nurse Health Advisor Direct Dial [phone]

## 2022-07-14 NOTE — Transitions of Care (Post Inpatient/ED Visit) (Unsigned)
   07/14/2022  Name: ZEPHYR FEBLES MRN: [id number] DOB: [birth year]  Today's TOC FU Call Status: Today's TOC FU Call Status:: Unsuccessful Call (2nd Attempt) Unsuccessful Call (1st Attempt) Date: 07/13/22 Unsuccessful Call (2nd Attempt) Date: 07/14/22  Attempted to reach the patient regarding the most recent Inpatient/ED visit.  Follow Up Plan: Additional outreach attempts will be made to reach the patient to complete the Transitions of Care (Post Inpatient/ED visit) call.   Signature Karena Addison, LPN [facility] Nurse Health Advisor Direct Dial [phone]

## 2022-07-15 NOTE — Transitions of Care (Post Inpatient/ED Visit) (Signed)
   07/15/2022  Name: Danielle [name] MRN: [id number] DOB: 09/14/1965  Today's TOC FU Call Status: Today's TOC FU Call Status:: Unsuccessful Call (3rd Attempt) Unsuccessful Call (1st Attempt) Date: 07/13/22 Unsuccessful Call (2nd Attempt) Date: 07/14/22 Unsuccessful Call (3rd Attempt) Date: 07/15/22  Attempted to reach the patient regarding the most recent Inpatient/ED visit.  Follow Up Plan: No further outreach attempts will be made at this time. We have been unable to contact the patient.  Signature Karena Addison, LPN [facility] Nurse Health Advisor Direct Dial [phone]

## 2023-08-27 DEATH — deceased
